# Patient Record
Sex: Female | Born: 1995 | Race: Asian | Hispanic: No | Smoking: Never smoker
Health system: Southern US, Community
[De-identification: ages and names within clinical notes are randomized; demographics above are authoritative.]

## PROBLEM LIST (undated history)

## (undated) DIAGNOSIS — L709 Acne, unspecified: Secondary | ICD-10-CM

## (undated) DIAGNOSIS — Z8759 Personal history of other complications of pregnancy, childbirth and the puerperium: Secondary | ICD-10-CM

## (undated) DIAGNOSIS — Z87440 Personal history of urinary (tract) infections: Secondary | ICD-10-CM

## (undated) DIAGNOSIS — T7840XA Allergy, unspecified, initial encounter: Secondary | ICD-10-CM

## (undated) DIAGNOSIS — J45909 Unspecified asthma, uncomplicated: Secondary | ICD-10-CM

## (undated) HISTORY — PX: NO PAST SURGERIES: SHX2092

## (undated) HISTORY — DX: Unspecified asthma, uncomplicated: J45.909

## (undated) HISTORY — DX: Allergy, unspecified, initial encounter: T78.40XA

## (undated) HISTORY — DX: Acne, unspecified: L70.9

## (undated) HISTORY — DX: Personal history of urinary (tract) infections: Z87.440

## (undated) HISTORY — DX: Personal history of urinary (tract) infections: Z87.59

---
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
## 2014-04-28 ENCOUNTER — Encounter: Payer: Self-pay | Admitting: Family Medicine

## 2014-04-28 ENCOUNTER — Ambulatory Visit (INDEPENDENT_AMBULATORY_CARE_PROVIDER_SITE_OTHER): Payer: No Typology Code available for payment source | Admitting: Family Medicine

## 2014-04-28 VITALS — BP 127/71 | HR 91 | Temp 98.3°F | Resp 18 | Wt 106.0 lb

## 2014-04-28 DIAGNOSIS — J45909 Unspecified asthma, uncomplicated: Secondary | ICD-10-CM

## 2014-04-28 DIAGNOSIS — Z00129 Encounter for routine child health examination without abnormal findings: Secondary | ICD-10-CM

## 2014-04-28 DIAGNOSIS — Z0000 Encounter for general adult medical examination without abnormal findings: Secondary | ICD-10-CM

## 2014-04-28 MED ORDER — ALBUTEROL SULFATE HFA 108 (90 BASE) MCG/ACT IN AERS: 2.0000 | INHALATION_SPRAY | Freq: Four times a day (QID) | RESPIRATORY_TRACT | Status: DC | PRN

## 2014-04-28 NOTE — Progress Notes (Signed)
Patient ID: Sheri Walker Marsico, female   DOB: 12-Aug-1996, 18 y.o.   MRN: [id number]  Subjective:     History was provided by the Patient; her mother was also present.  Sheri Walker Mikles is a 18 y.o. female who is here for this wellness visit.   Current Issues: Current concerns include:None  H (Home) Family Relationships: good Communication: good with parents Responsibilities: has responsibilities at home and is planning to attend school in August at [employer] (Major: Nursing)  E (Education): Grades: As School: good attendance Future Plans: college  A (Activities) Sports: no sports Exercise: No Activities: Friends and Family Friends: Yes   A (Auton/Safety) Auto: wears seat belt Bike: Did Not Ask Safety: can swim  D (Diet) Diet: Research scientist (physical sciences)icky Eater Risky eating habits: none Intake: We discussed the benefits of adequate Ca, Fe, and Vit D intake Body Image: positive body image  Drugs Tobacco: No Alcohol: Has tried, but doesn't like it (She was a little concerned about this and college) Drugs: Tried THC; did not like it. At all.  Sex Activity: sexually active and risky behaviors (Does not use protection; I asked about any desire for birth control (no), or condoms. She was receptive to the information I provided her with regard to safe sex practices with condoms. She admitted to the fact that it was awkward to buy condoms. I supplied her with a small baggy of condoms from our supply room)  Suicide Risk Emotions: healthy Depression: denies feelings of depression Suicidal: denies suicidal ideation     Objective:     Filed Vitals:   04/28/14 1429  BP: 127/71  Pulse: 91  Temp: 98.3 F (36.8 C)  TempSrc: Oral  Resp: 18  Weight: 106 lb (48.081 kg)  SpO2: 100%   Growth parameters are noted and are appropriate for age.  General:   alert, cooperative, appears stated age and no distress  Gait:   normal  Skin:   normal  Oral cavity:   lips, mucosa, and tongue normal; teeth  and gums normal  Eyes:   sclerae white, pupils equal and reactive, red reflex normal bilaterally  Ears:   normal bilaterally  Neck:   normal  Lungs:  clear to auscultation bilaterally  Heart:   regular rate and rhythm, S1, S2 normal, no murmur, click, rub or gallop  Abdomen:  soft, non-tender; bowel sounds normal; no masses,  no organomegaly  GU:  not examined  Extremities:   extremities normal, atraumatic, no cyanosis or edema  Neuro:  normal without focal findings, mental status, speech normal, alert and oriented x3 and PERLA     Assessment:    Healthy 18 y.o. female child.    Plan:   1. Anticipatory guidance discussed. Nutrition, Physical activity and Behavior  2. Follow-up visit in 12 months for next wellness visit, or sooner as needed.

## 2014-04-28 NOTE — Patient Instructions (Signed)
You're here for a well-child check. It was a pleasure meeting you. Good luck beginning school at [employer]. Remember to balance school with friends and exercise. Eat healthy, and stay active!  Remember that if you have any questions or need someone to talk to Dr. Leonides Schanzorsey, myself, or anyone here at the [facility] will make ourselves available for you.  Please see us back in one year.

## 2014-05-01 NOTE — Progress Notes (Signed)
[facility] ATTENDING  NOTE Sheri Mihalik,MD  I have discussed this patient with the resident. I agree with the resident's findings, assessment and care plan.   

## 2014-05-05 ENCOUNTER — Other Ambulatory Visit: Payer: Self-pay | Admitting: Family Medicine

## 2014-10-13 DIAGNOSIS — B009 Herpesviral infection, unspecified: Secondary | ICD-10-CM | POA: Insufficient documentation

## 2014-10-13 HISTORY — DX: Herpesviral infection, unspecified: B00.9

## 2014-10-28 ENCOUNTER — Emergency Department (HOSPITAL_COMMUNITY)
Admission: EM | Admit: 2014-10-28 | Discharge: 2014-10-28 | Disposition: A | Discharge status: Home / Self Care | Payer: Self-pay | Source: Home / Self Care

## 2016-10-23 DIAGNOSIS — Z113 Encounter for screening for infections with a predominantly sexual mode of transmission: Secondary | ICD-10-CM | POA: Diagnosis not present

## 2016-12-12 DIAGNOSIS — Z124 Encounter for screening for malignant neoplasm of cervix: Secondary | ICD-10-CM | POA: Diagnosis not present

## 2016-12-12 DIAGNOSIS — Z30011 Encounter for initial prescription of contraceptive pills: Secondary | ICD-10-CM | POA: Diagnosis not present

## 2017-06-08 DIAGNOSIS — N9089 Other specified noninflammatory disorders of vulva and perineum: Secondary | ICD-10-CM | POA: Diagnosis not present

## 2017-06-08 DIAGNOSIS — Z1159 Encounter for screening for other viral diseases: Secondary | ICD-10-CM | POA: Diagnosis not present

## 2017-06-08 DIAGNOSIS — N760 Acute vaginitis: Secondary | ICD-10-CM | POA: Diagnosis not present

## 2017-06-08 DIAGNOSIS — Z113 Encounter for screening for infections with a predominantly sexual mode of transmission: Secondary | ICD-10-CM | POA: Diagnosis not present

## 2017-06-08 DIAGNOSIS — A609 Anogenital herpesviral infection, unspecified: Secondary | ICD-10-CM | POA: Diagnosis not present

## 2017-06-11 DIAGNOSIS — B009 Herpesviral infection, unspecified: Secondary | ICD-10-CM | POA: Diagnosis not present

## 2017-06-11 DIAGNOSIS — Z113 Encounter for screening for infections with a predominantly sexual mode of transmission: Secondary | ICD-10-CM | POA: Diagnosis not present

## 2019-01-18 ENCOUNTER — Telehealth: Payer: Self-pay | Admitting: *Deleted

## 2019-01-18 NOTE — Telephone Encounter (Signed)
Per Dr Caryl Never it is probably best that the patient is evaluated at [facility] in case there is a need for further treatment that we are not able to do virtually.  I spoke with the patient and advised of this and explained to her the need to be evaluated by UC first.  Pt will call back once well and will at that time schedule her NP appt.   Nothing further needed.

## 2019-01-18 NOTE — Telephone Encounter (Signed)
Fever since Friday 01/14/19 of 98-102.8 and body aches.  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   Pt advised that she may need to be seen and treated at [facility] and then come back to [place] once well for her NP appt.

## 2020-01-14 ENCOUNTER — Ambulatory Visit: Payer: 59 | Attending: Internal Medicine

## 2020-01-14 DIAGNOSIS — Z23 Encounter for immunization: Secondary | ICD-10-CM

## 2020-01-14 NOTE — Progress Notes (Signed)
   Covid-19 Vaccination Clinic  Name:  Sheri Walker    MRN: [id number] DOB: 1996/06/11  01/14/2020  [name] was observed post Covid-19 immunization for 15 minutes without incident. She was provided with Vaccine Information Sheet and instruction to access the V-Safe system.   [name] was instructed to call 911 with any severe reactions post vaccine: Marland Kitchen Difficulty breathing  . Swelling of face and throat  . A fast heartbeat  . A bad rash all over body  . Dizziness and weakness   Immunizations Administered    Name Date Dose VIS Date Route   Pfizer COVID-19 Vaccine 01/14/2020  3:38 PM 0.3 mL 09/23/2019 Intramuscular   Manufacturer: [employer], [employer]   Lot: [id number]   NDC: [address]-7

## 2020-01-16 ENCOUNTER — Encounter (HOSPITAL_COMMUNITY): Payer: Self-pay

## 2020-01-16 ENCOUNTER — Other Ambulatory Visit: Payer: Self-pay

## 2020-01-16 DIAGNOSIS — R0602 Shortness of breath: Secondary | ICD-10-CM | POA: Diagnosis not present

## 2020-01-16 DIAGNOSIS — M7918 Myalgia, other site: Secondary | ICD-10-CM | POA: Insufficient documentation

## 2020-01-16 DIAGNOSIS — R062 Wheezing: Secondary | ICD-10-CM | POA: Insufficient documentation

## 2020-01-16 DIAGNOSIS — Z79899 Other long term (current) drug therapy: Secondary | ICD-10-CM | POA: Diagnosis not present

## 2020-01-16 DIAGNOSIS — Z91010 Allergy to peanuts: Secondary | ICD-10-CM | POA: Diagnosis not present

## 2020-01-16 DIAGNOSIS — Z20822 Contact with and (suspected) exposure to covid-19: Secondary | ICD-10-CM | POA: Diagnosis not present

## 2020-01-16 DIAGNOSIS — R05 Cough: Secondary | ICD-10-CM | POA: Diagnosis not present

## 2020-01-16 DIAGNOSIS — M791 Myalgia, unspecified site: Secondary | ICD-10-CM | POA: Diagnosis not present

## 2020-01-16 DIAGNOSIS — R5383 Other fatigue: Secondary | ICD-10-CM | POA: Diagnosis present

## 2020-01-16 NOTE — ED Triage Notes (Signed)
Pt reports cough, shortness of breath, and body aches that started last night. She is concerned that it could be COVID. Denies any known contacts. A&Ox4. No distress noted.

## 2020-01-17 ENCOUNTER — Emergency Department (HOSPITAL_COMMUNITY)
Admission: EM | Admit: 2020-01-17 | Discharge: 2020-01-17 | Disposition: A | Discharge status: Home / Self Care | Payer: 59 | Attending: Emergency Medicine | Admitting: Emergency Medicine

## 2020-01-17 ENCOUNTER — Emergency Department (HOSPITAL_COMMUNITY): Payer: 59

## 2020-01-17 DIAGNOSIS — R05 Cough: Secondary | ICD-10-CM

## 2020-01-17 DIAGNOSIS — Z20822 Contact with and (suspected) exposure to covid-19: Secondary | ICD-10-CM | POA: Diagnosis not present

## 2020-01-17 DIAGNOSIS — R062 Wheezing: Secondary | ICD-10-CM | POA: Diagnosis not present

## 2020-01-17 DIAGNOSIS — Z91010 Allergy to peanuts: Secondary | ICD-10-CM | POA: Diagnosis not present

## 2020-01-17 DIAGNOSIS — R059 Cough, unspecified: Secondary | ICD-10-CM

## 2020-01-17 DIAGNOSIS — Z79899 Other long term (current) drug therapy: Secondary | ICD-10-CM | POA: Diagnosis not present

## 2020-01-17 DIAGNOSIS — R0602 Shortness of breath: Secondary | ICD-10-CM | POA: Diagnosis not present

## 2020-01-17 DIAGNOSIS — M7918 Myalgia, other site: Secondary | ICD-10-CM | POA: Diagnosis not present

## 2020-01-17 DIAGNOSIS — M791 Myalgia, unspecified site: Secondary | ICD-10-CM

## 2020-01-17 LAB — RESPIRATORY PANEL BY RT PCR (FLU A&B, COVID)
Influenza A by PCR: NEGATIVE
Influenza B by PCR: NEGATIVE
SARS Coronavirus 2 by RT PCR: NEGATIVE

## 2020-01-17 MED ORDER — ALBUTEROL SULFATE HFA 108 (90 BASE) MCG/ACT IN AERS
6.0000 | INHALATION_SPRAY | Freq: Once | RESPIRATORY_TRACT | Status: AC
  Administered 2020-01-17: 03:00:00 6 via RESPIRATORY_TRACT
  Filled 2020-01-17: qty 6.7

## 2020-01-17 MED ORDER — DOXYCYCLINE HYCLATE 100 MG PO CAPS: 100.0000 mg | ORAL_CAPSULE | Freq: Two times a day (BID) | ORAL | 0 refills | Status: AC

## 2020-01-17 NOTE — ED Provider Notes (Signed)
[facility]-EMERGENCY DEPT Provider Note  CSN: [id number] Arrival date & time: 01/16/20 2312  Chief Complaint(s) Cough  HPI Sheri Walker is a 24 y.o. female who presents to the emergency department with with 2 days of generalized fatigue, myalgias, cough with mild shortness of breath and chest tightness.  Patient reports she received the Covid vaccine 1 day prior to symptom onset.  States that she might of had Covid last year but was never tested for it.  She is endorsing low-grade temperatures approximately 100 degrees.  No nausea or vomiting.  No abdominal pain.  No diarrhea.  No known sick contacts currently but reports that she works at [employer].  Reports that her symptoms including shortness of breath and chest tightness have improved while being here.  She reports a history of asthma but has not required albuterol inhalers.  HPI  Past Medical History Past Medical History:  Diagnosis Date  . Acne   . Allergy   . Asthma    Patient Active Problem List   Diagnosis Date Noted  . Well child check 09/29/2012  . DERMATITIS, ATOPIC 07/22/2010  . ACNE VULGARIS, FACIAL 07/22/2010  . 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 ASTHMA, INTERMITTENT 12/10/2006   Home Medication(s) Prior to Admission medications   Medication Sig Start Date End Date Taking? Authorizing Provider  albuterol (PROVENTIL HFA;VENTOLIN HFA) 108 (90 BASE) MCG/ACT inhaler Inhale 2 puffs into the lungs every 6 (six) hours as needed for wheezing. 04/28/14 04/28/15  McKeag, Janine Ores, MD  cephALEXin (KEFLEX) 500 MG capsule Take 1 capsule (500 mg total) by mouth 3 (three) times daily. 07/31/13   Reuben Likes, MD  chlorhexidine (HIBICLENS) 4 % external liquid Use as body wash twice weekly for 3 months. 09/09/13   Reuben Likes, MD  clindamycin-benzoyl peroxide [facility]) gel Apply topically 2 (two) times daily. 09/29/12   McGill, Marga Hoots, MD  doxycycline (VIBRAMYCIN) 100 MG  capsule Take 1 capsule (100 mg total) by mouth 2 (two) times daily for 7 days. 01/17/20 01/24/20  Nira Conn, MD  EPINEPHrine (EPIPEN JR 2-PAK) 0.15 MG/0.3ML injection Inject 0.3 mLs (0.15 mg total) into the muscle as directed. 09/29/12   McGill, Marga Hoots, MD  montelukast (SINGULAIR) 5 MG chewable tablet CHEW 2 TABLETS BY MOUTH AT BEDTIME 05/05/14   Joanna Puff, MD  mupirocin ointment (BACTROBAN) 2 % Apply topically 3 (three) times daily. 07/27/13   Reuben Likes, MD  mupirocin ointment (BACTROBAN) 2 % Apply 1 application topically 3 (three) times daily. 09/09/13   Reuben Likes, MD  sulfamethoxazole-trimethoprim (BACTRIM DS) 800-160 MG per tablet Take 2 tablets by mouth 2 (two) times daily. 07/27/13   Reuben Likes, MD  Past Surgical History History reviewed. No pertinent surgical history. Family History Family History  Problem Relation Age of Onset  . Allergies Mother     Social History Social History   Tobacco Use  . Smoking status: Never Smoker  Substance Use Topics  . Alcohol use: No  . Drug use: No   Allergies Patient has no known allergies.  Review of Systems Review of Systems All other systems are reviewed and are negative for acute change except as noted in the HPI  Physical Exam Vital Signs  I have reviewed the triage vital signs BP (!) 139/103   Pulse (!) 110   Temp 98.2 F (36.8 C) (Oral)   Resp 16   LMP  (LMP Unknown)   SpO2 97%   Physical Exam Vitals reviewed.  Constitutional:      General: She is not in acute distress.    Appearance: She is well-developed. She is not diaphoretic.  HENT:     Head: Normocephalic and atraumatic.     Nose: Nose normal.  Eyes:     General: No scleral icterus.       Right eye: No discharge.        Left eye: No discharge.     Conjunctiva/sclera: Conjunctivae normal.      Pupils: Pupils are equal, round, and reactive to light.  Cardiovascular:     Rate and Rhythm: Normal rate and regular rhythm.     Heart sounds: No murmur. No friction rub. No gallop.   Pulmonary:     Effort: Pulmonary effort is normal. No respiratory distress.     Breath sounds: No stridor. Wheezing (diffuse exp wheezing) present. No rales.  Abdominal:     General: There is no distension.     Palpations: Abdomen is soft.     Tenderness: There is no abdominal tenderness.  Musculoskeletal:        General: No tenderness.     Cervical back: Normal range of motion and neck supple.  Skin:    General: Skin is warm and dry.     Findings: No erythema or rash.  Neurological:     Mental Status: She is alert and oriented to person, place, and time.     ED Results and Treatments Labs (all labs ordered are listed, but only abnormal results are displayed) Labs Reviewed  RESPIRATORY PANEL BY RT PCR (FLU A&B, COVID)                                                                                                                         EKG  EKG Interpretation  Date/Time:  Tuesday January 17 2020 02:44:07 EDT Ventricular Rate:  88 PR Interval:    QRS Duration: 78 QT Interval:  353 QTC Calculation: 428 R Axis:   77 Text Interpretation: Sinus rhythm Biatrial enlargement No old tracing to compare Confirmed by Drema Pry [phone]) on 01/17/2020 2:52:07 AM      Radiology DG Chest [facility] 1 View  Result  Date: 01/17/2020 CLINICAL DATA:  Cough and shortness of breath times 24 hours EXAM: PORTABLE CHEST 1 VIEW COMPARISON:  None. FINDINGS: There is an airspace opacity in the medial right lower lung zone. There is no pneumothorax. No significant pleural effusion. The heart size is normal. There is no acute osseous abnormality. There is a small airspace opacity between the second and third ribs anteriorly on the right. IMPRESSION: Airspace opacity in the medial right lower lung zone consistent with pneumonia.  Follow-up to radiologic resolution is recommended. Small airspace opacity in the right upper lobe may represent an additional infiltrate. Electronically Signed   By: Katherine Mantle M.D.   On: 01/17/2020 02:31    Pertinent labs & imaging results that were available during my care of the patient were reviewed by me and considered in my medical decision making (see chart for details).  Medications Ordered in ED Medications  albuterol (VENTOLIN HFA) 108 (90 Base) MCG/ACT inhaler 6 puff (6 puffs Inhalation Given 01/17/20 0245)                                                                                                                                    Procedures Procedures  (including critical care time)  Medical Decision Making / ED Course I have reviewed the nursing notes for this encounter and the patient's prior records (if available in EHR or on provided paperwork).   Sheri Walker was evaluated in Emergency Department on 01/17/2020 for the symptoms described in the history of present illness. She was evaluated in the context of the global COVID-19 pandemic, which necessitated consideration that the patient might be at risk for infection with the SARS-CoV-2 virus that causes COVID-19. Institutional protocols and algorithms that pertain to the evaluation of patients at risk for COVID-19 are in a state of rapid change based on information released by regulatory bodies including the CDC and federal and state organizations. These policies and algorithms were followed during the patient's care in the ED.  Patient presents with viral symptoms for 2 days. adequate oral hydration. Rest of history as above.  Patient appears well. No signs of toxicity, patient is interactive. No hypoxia, tachypnea or other signs of respiratory distress. No sign of clinical dehydration. Lung exam with diffuse wheezing. Rest of exam as above.  CXR concerning for developing pneumonia.  Albuterol given for  wheezing.  Wheezing completely resolved.  No focal rales on lung sounds on repeat lung exam.  Suspicious for viral process.  COVID test ordered.  Discussed symptomatic treatment with the patient and they will follow closely with their PCP.   Patient prescribed preemptive antibiotics in case Covid test is negative and symptoms do not resolve within 3 to 5 days.     Final Clinical Impression(s) / ED Diagnoses Final diagnoses:  Cough  Myalgia  Wheezing   The patient appears reasonably screened and/or stabilized for discharge and I doubt any other medical  condition or other [facility] requiring further screening, evaluation, or treatment in the ED at this time prior to discharge. Safe for discharge with strict return precautions.  Disposition: Discharge  Condition: Good  I have discussed the results, Dx and Tx plan with the patient/family who expressed understanding and agree(s) with the plan. Discharge instructions discussed at length. The patient/family was given strict return precautions who verbalized understanding of the instructions. No further questions at time of discharge.    ED Discharge Orders         Ordered    doxycycline (VIBRAMYCIN) 100 MG capsule  2 times daily     01/17/20 0352           Follow Up: Primary care provider  Schedule an appointment as soon as possible for a visit        This chart was dictated using voice recognition software.  Despite best efforts to proofread,  errors can occur which can change the documentation meaning.   Fatima Blank, MD 01/17/20 [phone]

## 2020-02-07 ENCOUNTER — Ambulatory Visit: Payer: 59 | Attending: Internal Medicine

## 2020-02-07 DIAGNOSIS — Z23 Encounter for immunization: Secondary | ICD-10-CM

## 2020-02-07 NOTE — Progress Notes (Signed)
   Covid-19 Vaccination Clinic  Name:  Sheri Walker    MRN: [id number] DOB: 09-14-1996  02/07/2020  Sheri Walker was observed post Covid-19 immunization for 15 minutes without incident. She was provided with Vaccine Information Sheet and instruction to access the V-Safe system.   Sheri Walker was instructed to call 911 with any severe reactions post vaccine: Marland Kitchen Difficulty breathing  . Swelling of face and throat  . A fast heartbeat  . A bad rash all over body  . Dizziness and weakness   Immunizations Administered    Name Date Dose VIS Date Route   Pfizer COVID-19 Vaccine 02/07/2020  3:49 PM 0.3 mL 12/07/2018 Intramuscular   Manufacturer: [employer], [employer]   Lot: [id number]   NDC: [address]-1

## 2020-05-14 ENCOUNTER — Ambulatory Visit (INDEPENDENT_AMBULATORY_CARE_PROVIDER_SITE_OTHER): Payer: 59 | Admitting: *Deleted

## 2020-05-14 ENCOUNTER — Other Ambulatory Visit: Payer: Self-pay

## 2020-05-14 DIAGNOSIS — Z3200 Encounter for pregnancy test, result unknown: Secondary | ICD-10-CM

## 2020-05-14 DIAGNOSIS — Z3201 Encounter for pregnancy test, result positive: Secondary | ICD-10-CM | POA: Diagnosis not present

## 2020-05-14 LAB — POCT PREGNANCY, URINE: Preg Test, Ur: POSITIVE — AB

## 2020-05-14 NOTE — Progress Notes (Signed)
AddendumJanalyn Shy called back. I informed her upt was positive . States LMP 04/03/20 this makes her [redacted]w[redacted]d with EDD 01/09/20. She states she is taking prenatal vitamins and no other medicines.  I informed her we recommend she start prenatal care . She would like to go to our office. I informed her I will transfer her to registrar to schedule. She voices understanding. Calloway Andrus,RN

## 2020-05-14 NOTE — Progress Notes (Signed)
Patient dropped off urine for pregnancy test; then left. Pregnancy test positive. I called Loan and reached her voicemail. I left a message that I was calling back with information regarding her visit and will send a MyChart message. Please call us or message [place] if you have questions. Aslan Montagna,RN

## 2020-05-16 NOTE — Progress Notes (Signed)
Patient was assessed and managed by nursing staff during this encounter. I have reviewed the chart and agree with the documentation and plan. I have also made any necessary editorial changes.  Emmanuelle Hibbitts, MD 05/16/2020 8:34 AM   

## 2020-06-25 ENCOUNTER — Other Ambulatory Visit: Payer: Self-pay

## 2020-06-25 ENCOUNTER — Ambulatory Visit (INDEPENDENT_AMBULATORY_CARE_PROVIDER_SITE_OTHER): Payer: 59 | Admitting: Student

## 2020-06-25 ENCOUNTER — Other Ambulatory Visit (HOSPITAL_COMMUNITY)
Admission: RE | Admit: 2020-06-25 | Discharge: 2020-06-25 | Disposition: A | Discharge status: Home / Self Care | Payer: 59 | Source: Ambulatory Visit | Attending: Student | Admitting: Student

## 2020-06-25 ENCOUNTER — Encounter: Payer: Self-pay | Admitting: Student

## 2020-06-25 VITALS — Wt 124.0 lb

## 2020-06-25 DIAGNOSIS — Z3A11 11 weeks gestation of pregnancy: Secondary | ICD-10-CM

## 2020-06-25 DIAGNOSIS — Z3490 Encounter for supervision of normal pregnancy, unspecified, unspecified trimester: Secondary | ICD-10-CM | POA: Insufficient documentation

## 2020-06-25 DIAGNOSIS — Z3491 Encounter for supervision of normal pregnancy, unspecified, first trimester: Secondary | ICD-10-CM | POA: Diagnosis not present

## 2020-06-25 DIAGNOSIS — B009 Herpesviral infection, unspecified: Secondary | ICD-10-CM

## 2020-06-25 DIAGNOSIS — Z23 Encounter for immunization: Secondary | ICD-10-CM | POA: Diagnosis not present

## 2020-06-25 MED ORDER — TERCONAZOLE 0.4 % VA CREA
1.0000 | TOPICAL_CREAM | Freq: Every day | VAGINAL | 0 refills | Status: DC
Start: 2020-06-25 — End: 2020-12-03

## 2020-06-25 MED ORDER — BLOOD PRESSURE KIT DEVI: 1.0000 | Freq: Every day | 0 refills | Status: DC

## 2020-06-25 NOTE — Progress Notes (Signed)
  Subjective:    Sheri Walker is being seen today for her first obstetrical visit.  This is not a planned pregnancy. She is at [redacted]w[redacted]d gestation. Her obstetrical history is significant for nothing. Relationship with FOB: significant other, living together. Patient does intend to breast feed. Pregnancy history fully reviewed.  Patient reports no complaints.  Review of Systems:   Review of Systems  Constitutional: Negative.   HENT: Negative.   Respiratory: Negative.   Cardiovascular: Negative.   Genitourinary: Negative.   Neurological: Negative.   Hematological: Negative.     Objective:     Wt 124 lb (56.2 kg)   LMP 04/03/2020 (Exact Date)  Physical Exam Constitutional:      Appearance: Normal appearance.  HENT:     Head: Normocephalic.  Genitourinary:    General: Normal vulva.     Rectum: Normal.     Comments: NEFG; white clumpy discharge in the vagina, slight bleeding after pap, vaginal walls appear red and irritated.  Musculoskeletal:        General: Normal range of motion.  Skin:    General: Skin is warm.  Neurological:     Mental Status: She is alert.     Maternal Exam:  Introitus: Normal vulva.      Assessment:    Pregnancy: G2P0010 Patient Active Problem List   Diagnosis Date Noted  . Supervision of low-risk pregnancy 06/25/2020  . Well child check 09/29/2012  . DERMATITIS, ATOPIC 07/22/2010  . ACNE VULGARIS, FACIAL 07/22/2010  . 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 ASTHMA, INTERMITTENT 12/10/2006       Plan:     Initial labs drawn. Prenatal vitamins. Problem list reviewed and updated. AFP3 discussed: declined. Role of ultrasound in pregnancy discussed; fetal survey: ordered. Amniocentesis discussed: not indicated. Follow up in 4 weeks for virtual visit 50% of 30 min visit spent on counseling and coordination of care.  -Declined genetic testing -Signed up for [facility] -Will send RX for terazol -Welcomed patient to  practice, explained role of Apps, NPs, students, MDS/DOs.  -Patient got Covid vaccine in April  Charlesetta Garibaldi [facility] 06/25/2020

## 2020-06-25 NOTE — Patient Instructions (Signed)

## 2020-06-26 LAB — CBC/D/PLT+RPR+RH+ABO+RUB AB...
Antibody Screen: NEGATIVE
Basophils Absolute: 0 10*3/uL (ref 0.0–0.2)
Basos: 0 %
EOS (ABSOLUTE): 0.3 10*3/uL (ref 0.0–0.4)
Eos: 3 %
HCV Ab: <0.1 s/co ratio (ref 0.0–0.9)
HIV Screen 4th Generation wRfx: NONREACTIVE
Hematocrit: 43.5 % (ref 34.0–46.6)
Hemoglobin: 14.5 g/dL (ref 11.1–15.9)
Hepatitis B Surface Ag: NEGATIVE
Immature Grans (Abs): 0 10*3/uL (ref 0.0–0.1)
Immature Granulocytes: 0 %
Lymphocytes Absolute: 1 10*3/uL (ref 0.7–3.1)
Lymphs: 11 %
MCH: 29.7 pg (ref 26.6–33.0)
MCHC: 33.3 g/dL (ref 31.5–35.7)
MCV: 89 fL (ref 79–97)
Monocytes Absolute: 0.6 10*3/uL (ref 0.1–0.9)
Monocytes: 7 %
Neutrophils Absolute: 7 10*3/uL (ref 1.4–7.0)
Neutrophils: 79 %
Platelets: 260 10*3/uL (ref 150–450)
RBC: 4.88 x10E6/uL (ref 3.77–5.28)
RDW: 14.2 % (ref 11.7–15.4)
RPR Ser Ql: NONREACTIVE
Rh Factor: POSITIVE
Rubella Antibodies, IGG: 1.15 index (ref >0.99)
WBC: 9 10*3/uL (ref 3.4–10.8)

## 2020-06-26 LAB — CERVICOVAGINAL ANCILLARY ONLY
Bacterial Vaginitis (gardnerella): NEGATIVE
Candida Glabrata: NEGATIVE
Candida Vaginitis: NEGATIVE
Comment: NEGATIVE
Comment: NEGATIVE
Comment: NEGATIVE

## 2020-06-26 LAB — CYTOLOGY - PAP
Chlamydia: NEGATIVE
Comment: NEGATIVE
Comment: NEGATIVE
Comment: NORMAL
Diagnosis: NEGATIVE
Neisseria Gonorrhea: NEGATIVE
Trichomonas: NEGATIVE

## 2020-06-26 LAB — POCT URINALYSIS DIP (DEVICE)
Bilirubin Urine: NEGATIVE
Glucose, UA: NEGATIVE mg/dL
Hgb urine dipstick: NEGATIVE
Ketones, ur: NEGATIVE mg/dL
Nitrite: NEGATIVE
Protein, ur: NEGATIVE mg/dL
Specific Gravity, Urine: 1.02 (ref 1.005–1.030)
Urobilinogen, UA: 0.2 mg/dL (ref 0.0–1.0)
pH: 7.5 (ref 5.0–8.0)

## 2020-06-26 LAB — HEMOGLOBIN A1C
Est. average glucose Bld gHb Est-mCnc: 97 mg/dL
Hgb A1c MFr Bld: 5 % (ref 4.8–5.6)

## 2020-06-26 LAB — HCV INTERPRETATION

## 2020-06-30 LAB — URINE CULTURE, OB REFLEX

## 2020-06-30 LAB — CULTURE, OB URINE

## 2020-07-02 ENCOUNTER — Other Ambulatory Visit: Payer: Self-pay | Admitting: Student

## 2020-07-02 DIAGNOSIS — O2341 Unspecified infection of urinary tract in pregnancy, first trimester: Secondary | ICD-10-CM | POA: Insufficient documentation

## 2020-07-02 MED ORDER — CEPHALEXIN 500 MG PO CAPS: 500.0000 mg | ORAL_CAPSULE | Freq: Four times a day (QID) | ORAL | 0 refills | Status: DC

## 2020-07-23 ENCOUNTER — Encounter: Payer: Self-pay | Admitting: *Deleted

## 2020-07-23 ENCOUNTER — Ambulatory Visit (INDEPENDENT_AMBULATORY_CARE_PROVIDER_SITE_OTHER): Payer: 59 | Admitting: Student

## 2020-07-23 VITALS — BP 130/84 | HR 95 | Wt 136.3 lb

## 2020-07-23 DIAGNOSIS — Z3492 Encounter for supervision of normal pregnancy, unspecified, second trimester: Secondary | ICD-10-CM

## 2020-07-23 DIAGNOSIS — Z3A15 15 weeks gestation of pregnancy: Secondary | ICD-10-CM

## 2020-07-23 MED ORDER — VALACYCLOVIR HCL 1 G PO TABS: 1000.0000 mg | ORAL_TABLET | Freq: Two times a day (BID) | ORAL | 5 refills | Status: DC

## 2020-07-23 MED ORDER — EPINEPHRINE 0.3 MG/0.3ML IJ SOAJ: 0.3000 mg | INTRAMUSCULAR | 1 refills | Status: DC | PRN

## 2020-07-23 MED ORDER — ALBUTEROL SULFATE HFA 108 (90 BASE) MCG/ACT IN AERS: 2.0000 | INHALATION_SPRAY | Freq: Four times a day (QID) | RESPIRATORY_TRACT | 1 refills | Status: DC | PRN

## 2020-07-23 MED ORDER — BUDESONIDE 90 MCG/ACT IN AEPB: 1.0000 | INHALATION_SPRAY | Freq: Two times a day (BID) | RESPIRATORY_TRACT | 4 refills | Status: DC

## 2020-07-23 NOTE — Progress Notes (Signed)
   PRENATAL VISIT NOTE  Subjective:  Sheri Walker is a 24 y.o. G2P0010 at [redacted]w[redacted]d being seen today for ongoing prenatal care.  She is currently monitored for the following issues for this low-risk pregnancy and has ASTHMA, INTERMITTENT; DERMATITIS, ATOPIC; ACNE VULGARIS, FACIAL; PERSONAL HISTORY OF ALLERGY TO PEANUTS; Well child check; Supervision of low-risk pregnancy; HSV-2 (herpes simplex virus 2) infection; and UTI (urinary tract infection) in pregnancy in first trimester on their problem list.  Patient reports no complaints. Had HSV small outbreak, took Valtrex and it went away. She reports that her epipen and her inhaler are both expired. She is having to use her inhaler at night at least times a week "right before bed". She reports wheezes that are audible to her and her boyfriend.    Contractions: Not present. Vag. Bleeding: None.  Movement: Absent. Denies leaking of fluid.   The following portions of the patient's history were reviewed and updated as appropriate: allergies, current medications, past family history, past medical history, past social history, past surgical history and problem list.   Objective:   Vitals:   07/23/20 1005  BP: 130/84  Pulse: 95  Weight: 136 lb 4.8 oz (61.8 kg)    Fetal Status: Fetal Heart Rate (bpm): 158   Movement: Absent     General:  Alert, oriented and cooperative. Patient is in no acute distress.  Skin: Skin is warm and dry. No rash noted.   Cardiovascular: Normal heart rate noted  Respiratory: Normal respiratory effort, no problems with respiration noted  Abdomen: Soft, gravid, appropriate for gestational age.  Pain/Pressure: Absent     Pelvic: Cervical exam deferred        Extremities: Normal range of motion.  Edema: None  Mental Status: Normal mood and affect. Normal behavior. Normal judgment and thought content.   Assessment and Plan:  Pregnancy: G2P0010 at [redacted]w[redacted]d 1. Encounter for supervision of low-risk pregnancy in second  trimester -RX for Valtrex given -Rx for Epi pen and Inhaler given -Given that patient is reporting more audible wheezes at night, will recommend steriod inhaler (1 puff twice a day at the lowest dose). Also refill given on rescue inhaler.  - Culture, OB Urine - AFP, Serum, Open Spina Bifida - albuterol (VENTOLIN HFA) 108 (90 Base) MCG/ACT inhaler; Inhale 2 puffs into the lungs every 6 (six) hours as needed for wheezing.  Dispense: 1 each; Refill: 1  Preterm labor symptoms and general obstetric precautions including but not limited to vaginal bleeding, contractions, leaking of fluid and fetal movement were reviewed in detail with the patient. Please refer to After Visit Summary for other counseling recommendations.   Return in about 4 weeks (around 08/20/2020), or Try to make appt on same day as [place] on 11/8.  Future Appointments  Date Time Provider Department Center  08/20/2020  8:45 AM [facility] US5 [facility]  08/20/2020  1:35 PM Raelyn Mora, CNM [facility]    Marylene Land, [place]

## 2020-07-24 LAB — AFP, SERUM, OPEN SPINA BIFIDA
AFP MoM: 1.25
AFP Value: 42.8 ng/mL
Gest. Age on Collection Date: 15.6 weeks
Maternal Age At EDD: 25.4 yr
OSBR Risk 1 IN: 5576
Test Results:: NEGATIVE
Weight: 136 [lb_av]

## 2020-07-25 LAB — CULTURE, OB URINE

## 2020-07-25 LAB — URINE CULTURE, OB REFLEX

## 2020-08-15 ENCOUNTER — Other Ambulatory Visit: Payer: Self-pay | Admitting: Student

## 2020-08-15 ENCOUNTER — Telehealth: Payer: Self-pay | Admitting: Student

## 2020-08-15 MED ORDER — BUDESONIDE-FORMOTEROL FUMARATE 80-4.5 MCG/ACT IN AERO: 2.0000 | INHALATION_SPRAY | Freq: Two times a day (BID) | RESPIRATORY_TRACT | 12 refills | Status: DC

## 2020-08-20 ENCOUNTER — Ambulatory Visit: Payer: 59 | Attending: Student

## 2020-08-20 ENCOUNTER — Other Ambulatory Visit: Payer: Self-pay

## 2020-08-20 ENCOUNTER — Ambulatory Visit (INDEPENDENT_AMBULATORY_CARE_PROVIDER_SITE_OTHER): Payer: 59 | Admitting: Obstetrics and Gynecology

## 2020-08-20 ENCOUNTER — Encounter: Payer: Self-pay | Admitting: Obstetrics and Gynecology

## 2020-08-20 VITALS — BP 121/70 | HR 75 | Wt 139.0 lb

## 2020-08-20 DIAGNOSIS — Z3A19 19 weeks gestation of pregnancy: Secondary | ICD-10-CM

## 2020-08-20 DIAGNOSIS — Z3491 Encounter for supervision of normal pregnancy, unspecified, first trimester: Secondary | ICD-10-CM | POA: Diagnosis not present

## 2020-08-20 DIAGNOSIS — Z3492 Encounter for supervision of normal pregnancy, unspecified, second trimester: Secondary | ICD-10-CM

## 2020-08-20 IMAGING — US US MFM OB COMP +14 WKS
1 series · 13 of 28 positions shown · non-contrast
Comparison: none

[Series 1: us mfm ob comp +14 wks · 100 acquisitions, 13 frames shown]
[im 4/100]
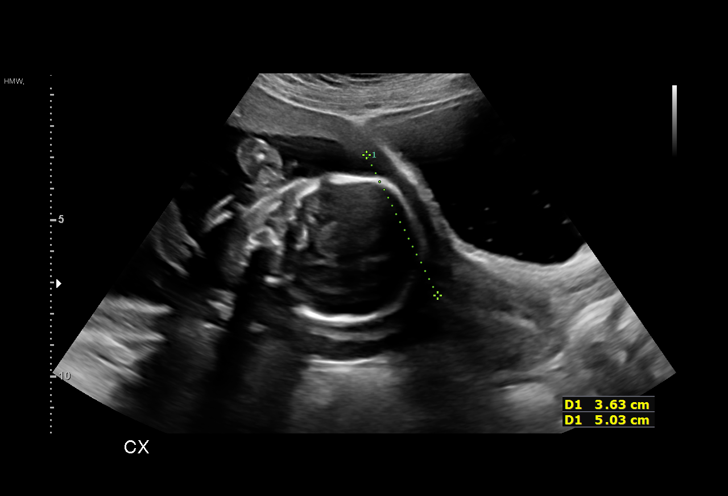
[im 12/100]
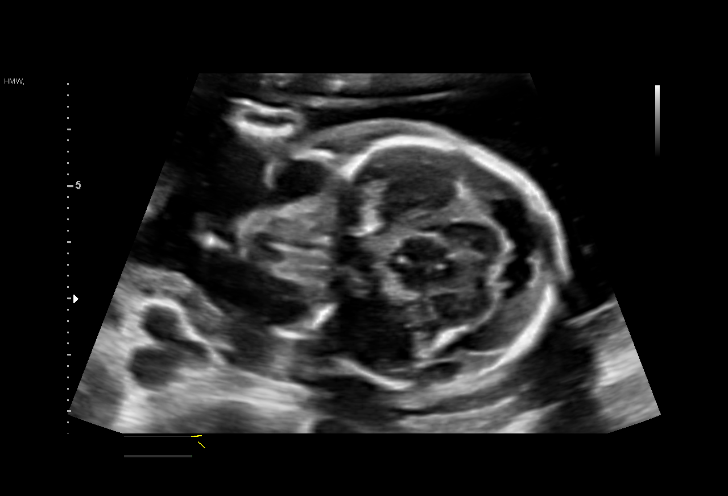
[im 19/100]
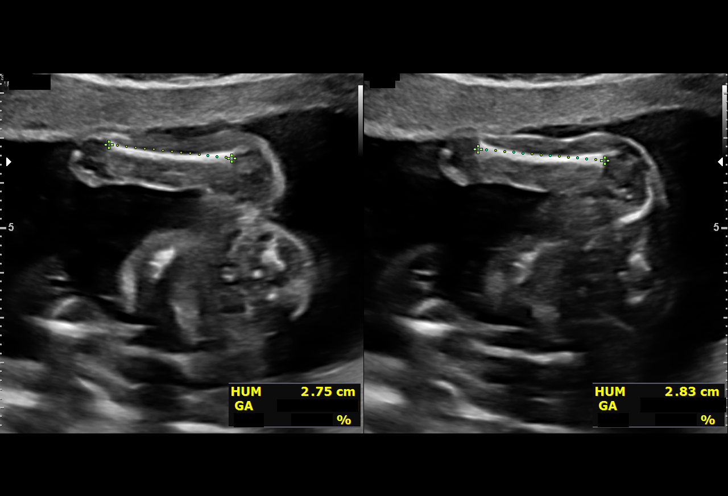
[im 26/100]
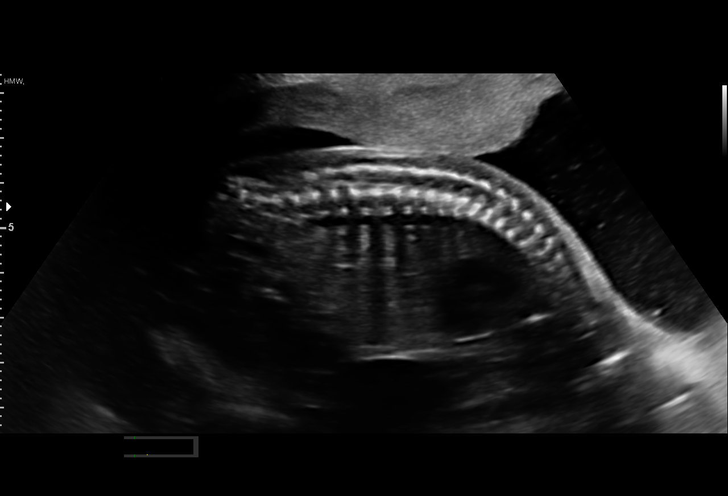
[im 34/100]
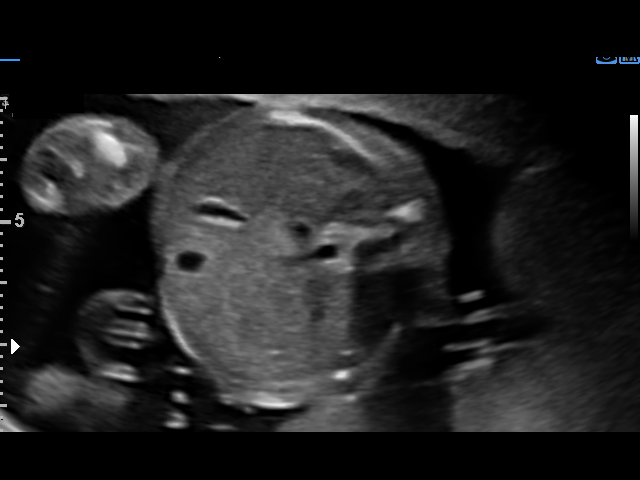
[im 41/100]
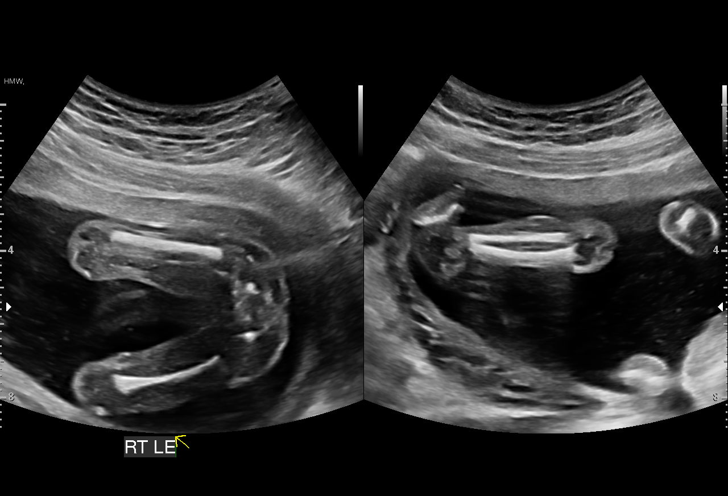
[im 52/100]
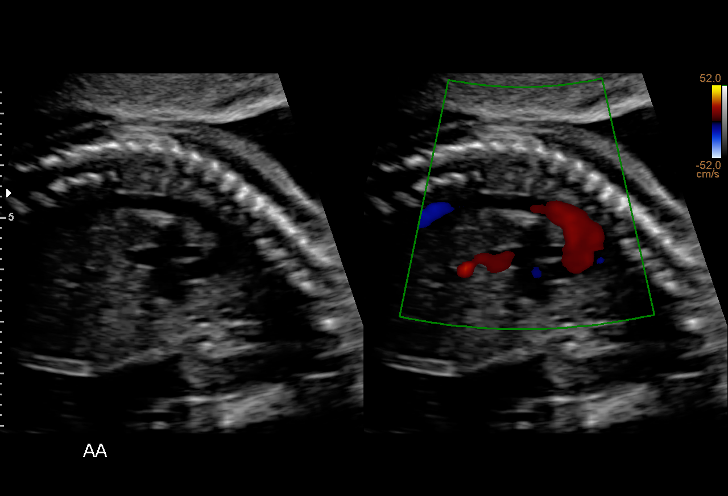
[im 59/100]
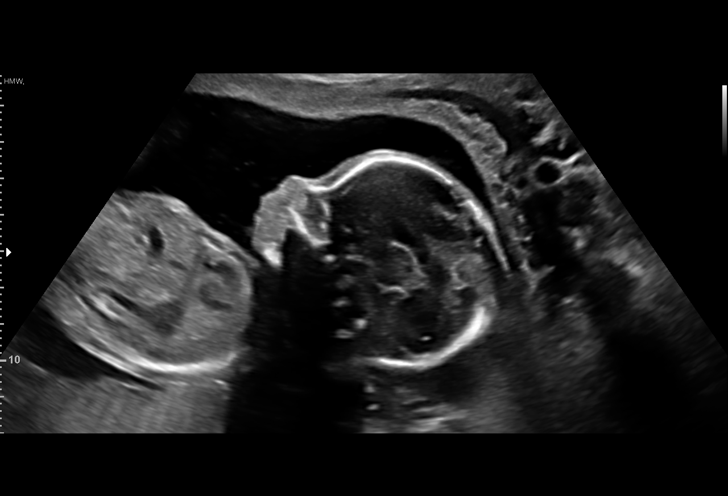
[im 67/100]
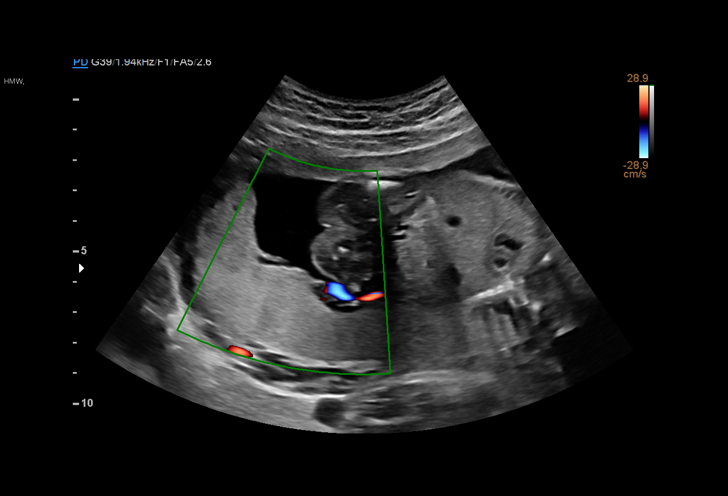
[im 74/100]
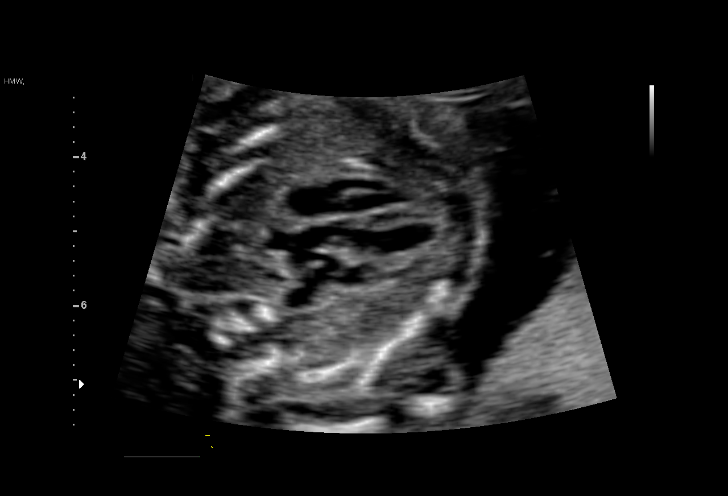
[im 81/100]
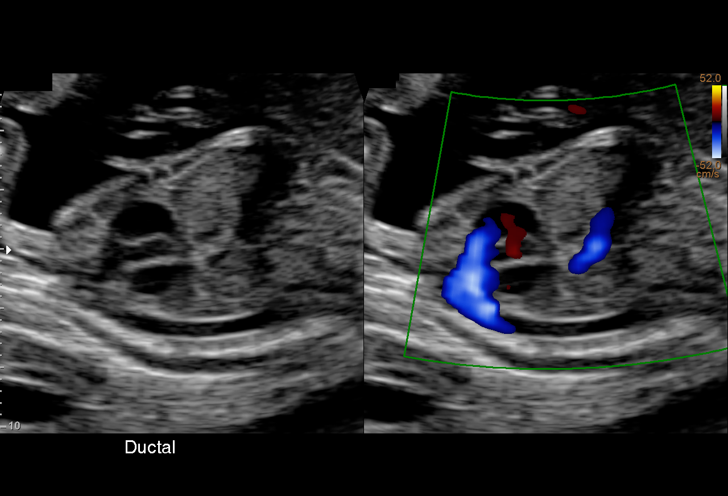
[im 89/100]
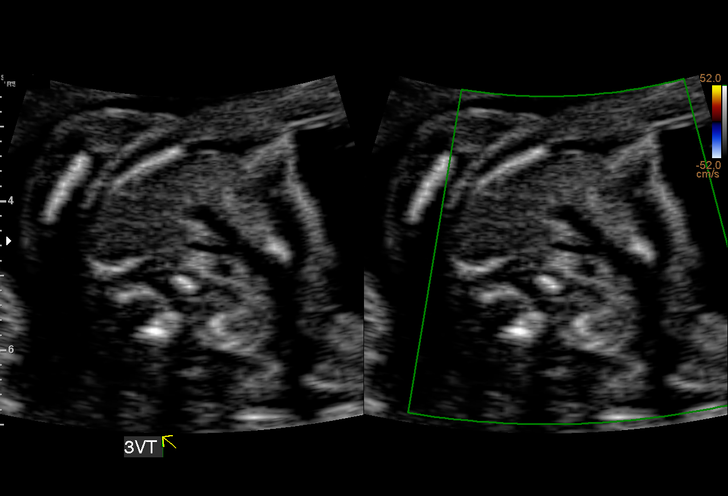
[im 96/100]
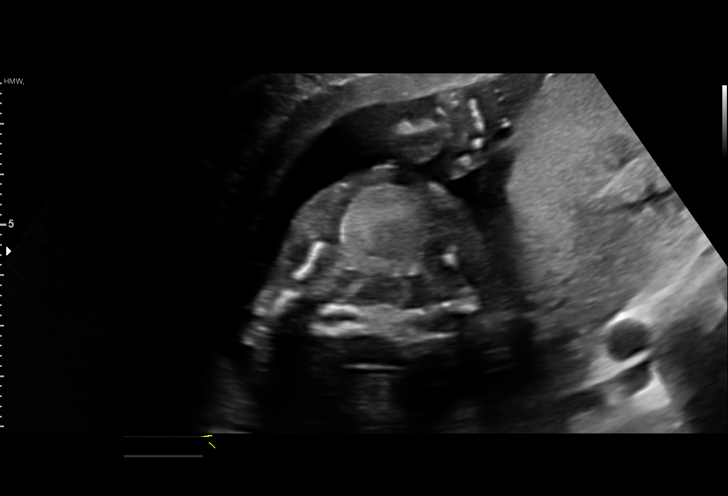

[13 of 28 positions shown; findings below may reference images not displayed]

1  US MFM OB COMP + 14 WK                76805.01    MOATSHE

Indications

 19 weeks gestation of pregnancy
 Encounter for antenatal screening for          [JZ]
 malformations
 Neg AFP
Fetal Evaluation

 Num Of Fetuses:         1
 Fetal Heart Rate(bpm):  143
 Cardiac Activity:       Observed
 Presentation:           Cephalic
 Placenta:               Posterior Fundal
 P. Cord Insertion:      Visualized, central

 Amniotic Fluid
 AFI FV:      Within normal limits

                             Largest Pocket(cm)

Biometry

 BPD:      46.2  mm     G. Age:  20w 0d         55  %    CI:        75.35   %    70 - 86
                                                         FL/HC:      18.5   %    16.8 -
 HC:      168.8  mm     G. Age:  19w 4d         27  %    HC/AC:      1.07        1.09 -
 AC:      157.9  mm     G. Age:  20w 6d         78  %    FL/BPD:     67.7   %
 FL:       31.3  mm     G. Age:  19w 5d         37  %    FL/AC:      19.8   %    20 - 24
 HUM:      27.9  mm     G. Age:  19w 0d         30  %
 CER:      20.8  mm     G. Age:  19w 6d         69  %
 CM:        6.3  mm

 Est. FW:     343  gm    0 lb 12 oz      70  %
OB History

 Gravidity:    2         Term:   0        Prem:   0        SAB:   0
 TOP:          1       Ectopic:  0        Living: 0
Gestational Age

 LMP:           19w 6d        Date:  [DATE]                 EDD:   [DATE]
 U/S Today:     20w 0d                                        EDD:   [DATE]
 Best:          19w 6d     Det. By:  LMP  ([DATE])          EDD:   [DATE]
Anatomy

 Cranium:               Appears normal         LVOT:                   Appears normal
 Cavum:                 Appears normal         Aortic Arch:            Appears normal
 Ventricles:            Appears normal         Ductal Arch:            Appears normal
 Choroid Plexus:        Appears normal         Diaphragm:              Appears normal
 Cerebellum:            Appears normal         Stomach:                Appears normal, left
                                                                       sided
 Posterior Fossa:       Appears normal         Abdomen:                Appears normal
 Nuchal Fold:           Not applicable (>20    Abdominal Wall:         Appears nml (cord
                        wks GA)                                        insert, abd wall)
 Face:                  Appears normal         Cord Vessels:           Appears normal (3
                        (orbits and profile)                           vessel cord)
 Lips:                  Appears normal         Kidneys:                Appear normal
 Palate:                Appears normal         Bladder:                Appears normal
 Thoracic:              Appears normal         Spine:                  Appears normal
 Heart:                 Appears normal         Upper Extremities:      Appears normal
                        (4CH, axis, and
                        situs)
 RVOT:                  Appears normal         Lower Extremities:      Appears normal

 Other:  Heels visualized. Hands not well visualized. Female fetus. Parents do
         not wish to know sex of fetus. Technically difficult due to fetal position.
Cervix Uterus Adnexa

 Cervix
 Length:            4.3  cm.
 Closed . Normal appearance by transabdominal scan.

 Uterus
 No abnormality visualized.

 Right Ovary
 No adnexal mass visualized.

 Left Ovary
 No adnexal mass visualized.

 Cul De Sac
 No free fluid seen.
 Adnexa
 No abnormality visualized.
Impression

 We performed fetal anatomy scan. No makers of
 aneuploidies or fetal structural defects are seen. Fetal
 biometry is consistent with her previously-established dates.
 Amniotic fluid is normal and good fetal activity is seen.
 Patient understands the limitations of ultrasound in detecting
 fetal anomalies.
 Patient had opted not to screen for fetal aneuploidies.
 MSAFP screening showed low risk for open-neural tube
 defects .

 "MyChart":Patient does not want to know fetal sex/gender.
 We informed her that fetal sex/gender will be mentioned in
 ultrasound report and will be in "MyChart" that will be
 accessible to the patient .
Recommendations

 Follow-up scans as clinically indicated.
                 MOATSHE

## 2020-08-20 NOTE — Patient Instructions (Signed)

## 2020-08-20 NOTE — Progress Notes (Signed)
   LOW-RISK PREGNANCY OFFICE VISIT Patient name: Sheri Walker MRN [id number]  Date of birth: 09/03/1996 Chief Complaint:   Routine Prenatal Visit  History of Present Illness:   Sheri Walker is a 24 y.o. G70P0010 female at [redacted]w[redacted]d with an Estimated Date of Delivery: 01/08/21 being seen today for ongoing management of a low-risk pregnancy.  Today she reports no complaints. Contractions: Not present. Vag. Bleeding: None.  Movement: Absent. denies leaking of fluid. Review of Systems:   Pertinent items are noted in HPI Denies abnormal vaginal discharge w/ itching/odor/irritation, headaches, visual changes, shortness of breath, chest pain, abdominal pain, severe nausea/vomiting, or problems with urination or bowel movements unless otherwise stated above. Pertinent History Reviewed:  Reviewed past medical,surgical, social, obstetrical and family history.  Reviewed problem list, medications and allergies. Physical Assessment:   Vitals:   08/20/20 1338  BP: 121/70  Pulse: 75  Weight: 139 lb (63 kg)  There is no height or weight on file to calculate BMI.        Physical Examination:   General appearance: Well appearing, and in no distress  Mental status: Alert, oriented to person, place, and time  Skin: Warm & dry  Cardiovascular: Normal heart rate noted  Respiratory: Normal respiratory effort, no distress  Abdomen: Soft, gravid, nontender  Pelvic: Cervical exam deferred         Extremities: Edema: None  Fetal Status: Fetal Heart Rate (bpm): 143   Movement: Absent     Assessment & Plan:  1) Low-risk pregnancy G2P0010 at [redacted]w[redacted]d with an Estimated Date of Delivery: 01/08/21   2) Encounter for supervision of low-risk pregnancy in second trimester - Reviewed U/S results - Advised to not look at U/S results in My Chart since they do not want to know the gender at this time  3) [redacted] weeks gestation of pregnancy   Meds: No orders of the defined types were placed in this  encounter.  Labs/procedures today: Anatomy U/S  Plan:  Continue routine obstetrical care   Reviewed: Preterm labor symptoms and general obstetric precautions including but not limited to vaginal bleeding, contractions, leaking of fluid and fetal movement were reviewed in detail with the patient.  All questions were answered. Has home bp cuff. Check bp weekly, let us know if >140/90.   Follow-up: Return in about 4 weeks (around 09/17/2020) for Return OB - My Chart video.   Raelyn Mora MSN, CNM 08/20/2020 1:52 PM

## 2020-09-05 ENCOUNTER — Other Ambulatory Visit: Payer: Self-pay

## 2020-09-05 ENCOUNTER — Encounter (HOSPITAL_COMMUNITY): Payer: Self-pay | Admitting: Obstetrics and Gynecology

## 2020-09-05 ENCOUNTER — Inpatient Hospital Stay (HOSPITAL_COMMUNITY)
Admission: AD | Admit: 2020-09-05 | Discharge: 2020-09-08 | DRG: 832 | Disposition: A | Discharge status: Home / Self Care | Payer: 59 | Attending: Obstetrics and Gynecology | Admitting: Obstetrics and Gynecology

## 2020-09-05 ENCOUNTER — Inpatient Hospital Stay (HOSPITAL_COMMUNITY): Payer: 59

## 2020-09-05 DIAGNOSIS — Z20822 Contact with and (suspected) exposure to covid-19: Secondary | ICD-10-CM | POA: Diagnosis not present

## 2020-09-05 DIAGNOSIS — Z3A22 22 weeks gestation of pregnancy: Secondary | ICD-10-CM

## 2020-09-05 DIAGNOSIS — O2300 Infections of kidney in pregnancy, unspecified trimester: Secondary | ICD-10-CM

## 2020-09-05 DIAGNOSIS — O99282 Endocrine, nutritional and metabolic diseases complicating pregnancy, second trimester: Secondary | ICD-10-CM | POA: Diagnosis not present

## 2020-09-05 DIAGNOSIS — E871 Hypo-osmolality and hyponatremia: Secondary | ICD-10-CM | POA: Diagnosis not present

## 2020-09-05 DIAGNOSIS — Z87440 Personal history of urinary (tract) infections: Secondary | ICD-10-CM | POA: Diagnosis present

## 2020-09-05 DIAGNOSIS — O2302 Infections of kidney in pregnancy, second trimester: Secondary | ICD-10-CM | POA: Diagnosis not present

## 2020-09-05 DIAGNOSIS — N12 Tubulo-interstitial nephritis, not specified as acute or chronic: Secondary | ICD-10-CM | POA: Diagnosis not present

## 2020-09-05 DIAGNOSIS — A6000 Herpesviral infection of urogenital system, unspecified: Secondary | ICD-10-CM | POA: Diagnosis present

## 2020-09-05 DIAGNOSIS — B9620 Unspecified Escherichia coli [E. coli] as the cause of diseases classified elsewhere: Secondary | ICD-10-CM | POA: Diagnosis present

## 2020-09-05 DIAGNOSIS — O98312 Other infections with a predominantly sexual mode of transmission complicating pregnancy, second trimester: Secondary | ICD-10-CM | POA: Diagnosis not present

## 2020-09-05 DIAGNOSIS — Z8759 Personal history of other complications of pregnancy, childbirth and the puerperium: Secondary | ICD-10-CM | POA: Diagnosis present

## 2020-09-05 DIAGNOSIS — E876 Hypokalemia: Secondary | ICD-10-CM | POA: Diagnosis present

## 2020-09-05 DIAGNOSIS — N1330 Unspecified hydronephrosis: Secondary | ICD-10-CM | POA: Diagnosis not present

## 2020-09-05 DIAGNOSIS — R509 Fever, unspecified: Secondary | ICD-10-CM | POA: Diagnosis not present

## 2020-09-05 LAB — CBC WITH DIFFERENTIAL/PLATELET
Abs Immature Granulocytes: 0.14 10*3/uL — ABNORMAL HIGH (ref 0.00–0.07)
Basophils Absolute: 0 10*3/uL (ref 0.0–0.1)
Basophils Relative: 0 %
Eosinophils Absolute: 0 10*3/uL (ref 0.0–0.5)
Eosinophils Relative: 0 %
HCT: 35.2 % — ABNORMAL LOW (ref 36.0–46.0)
Hemoglobin: 11.7 g/dL — ABNORMAL LOW (ref 12.0–15.0)
Immature Granulocytes: 1 %
Lymphocytes Relative: 4 %
Lymphs Abs: 0.6 10*3/uL — ABNORMAL LOW (ref 0.7–4.0)
MCH: 31.9 pg (ref 26.0–34.0)
MCHC: 33.2 g/dL (ref 30.0–36.0)
MCV: 95.9 fL (ref 80.0–100.0)
Monocytes Absolute: 2.2 10*3/uL — ABNORMAL HIGH (ref 0.1–1.0)
Monocytes Relative: 14 %
Neutro Abs: 13.2 10*3/uL — ABNORMAL HIGH (ref 1.7–7.7)
Neutrophils Relative %: 81 %
Platelets: 189 10*3/uL (ref 150–400)
RBC: 3.67 MIL/uL — ABNORMAL LOW (ref 3.87–5.11)
RDW: 12.3 % (ref 11.5–15.5)
WBC: 16.2 10*3/uL — ABNORMAL HIGH (ref 4.0–10.5)
nRBC: 0 % (ref 0.0–0.2)

## 2020-09-05 LAB — COMPREHENSIVE METABOLIC PANEL
ALT: 22 U/L (ref 0–44)
AST: 19 U/L (ref 15–41)
Albumin: 2.6 g/dL — ABNORMAL LOW (ref 3.5–5.0)
Alkaline Phosphatase: 45 U/L (ref 38–126)
Anion gap: 9 (ref 5–15)
BUN: <5 mg/dL — ABNORMAL LOW (ref 6–20)
CO2: 20 mmol/L — ABNORMAL LOW (ref 22–32)
Calcium: 8.2 mg/dL — ABNORMAL LOW (ref 8.9–10.3)
Chloride: 101 mmol/L (ref 98–111)
Creatinine, Ser: 0.71 mg/dL (ref 0.44–1.00)
GFR, Estimated: >60 mL/min (ref >60)
Glucose, Bld: 121 mg/dL — ABNORMAL HIGH (ref 70–99)
Potassium: 3.3 mmol/L — ABNORMAL LOW (ref 3.5–5.1)
Sodium: 130 mmol/L — ABNORMAL LOW (ref 135–145)
Total Bilirubin: 0.3 mg/dL (ref 0.3–1.2)
Total Protein: 5.7 g/dL — ABNORMAL LOW (ref 6.5–8.1)

## 2020-09-05 LAB — URINALYSIS, ROUTINE W REFLEX MICROSCOPIC
Bilirubin Urine: NEGATIVE
Glucose, UA: 50 mg/dL — AB
Hgb urine dipstick: NEGATIVE
Ketones, ur: NEGATIVE mg/dL
Nitrite: NEGATIVE
Protein, ur: NEGATIVE mg/dL
Specific Gravity, Urine: 1.004 — ABNORMAL LOW (ref 1.005–1.030)
pH: 6 (ref 5.0–8.0)

## 2020-09-05 LAB — RESPIRATORY PANEL BY RT PCR (FLU A&B, COVID)
Influenza A by PCR: NEGATIVE
Influenza B by PCR: NEGATIVE
SARS Coronavirus 2 by RT PCR: NEGATIVE

## 2020-09-05 LAB — TYPE AND SCREEN
ABO/RH(D): O POS
Antibody Screen: NEGATIVE

## 2020-09-05 MED ORDER — SODIUM CHLORIDE 0.9 % IV SOLN
2.0000 g | INTRAVENOUS | Status: DC
  Administered 2020-09-05 – 2020-09-07 (×3): 2 g via INTRAVENOUS
  Filled 2020-09-05 (×4): qty 20

## 2020-09-05 MED ORDER — CALCIUM CARBONATE ANTACID 500 MG PO CHEW: 2.0000 | CHEWABLE_TABLET | ORAL | Status: DC | PRN

## 2020-09-05 MED ORDER — ACETAMINOPHEN 325 MG PO TABS
650.0000 mg | ORAL_TABLET | ORAL | Status: DC | PRN
  Administered 2020-09-05 – 2020-09-08 (×7): 650 mg via ORAL
  Filled 2020-09-05 (×7): qty 2

## 2020-09-05 MED ORDER — ZOLPIDEM TARTRATE 5 MG PO TABS: 5.0000 mg | ORAL_TABLET | Freq: Every evening | ORAL | Status: DC | PRN

## 2020-09-05 MED ORDER — SODIUM CHLORIDE 0.9 % IV SOLN
INTRAVENOUS | Status: DC
  Administered 2020-09-05: 125 mL/h via INTRAVENOUS

## 2020-09-05 MED ORDER — POTASSIUM CHLORIDE CRYS ER 20 MEQ PO TBCR
20.0000 meq | EXTENDED_RELEASE_TABLET | Freq: Two times a day (BID) | ORAL | Status: AC
  Administered 2020-09-05 – 2020-09-07 (×6): 20 meq via ORAL
  Filled 2020-09-05 (×7): qty 1

## 2020-09-05 MED ORDER — PRENATAL MULTIVITAMIN CH
1.0000 | ORAL_TABLET | Freq: Every day | ORAL | Status: DC
  Administered 2020-09-05 – 2020-09-07 (×3): 1 via ORAL
  Filled 2020-09-05 (×4): qty 1

## 2020-09-05 MED ORDER — DOCUSATE SODIUM 100 MG PO CAPS
100.0000 mg | ORAL_CAPSULE | Freq: Every day | ORAL | Status: DC
  Filled 2020-09-05 (×2): qty 1

## 2020-09-05 NOTE — MAU Provider Note (Signed)
History     CSN: [id number]  Arrival date and time: 09/05/20 1003   First Provider Initiated Contact with Patient 09/05/20 1207      Chief Complaint  Patient presents with  . Urinary Tract Infection   HPI   Sheri Walker is a 24 y.o. female G2P0010 @ 12w1dhere in MAU with complaints of fever, dark urine, dysuria, right lower back pain. Symptoms have been present for 3 days. She reports her fever as high as 103. She has been taking tylenol around the clock, her last dose was yesterday. She is fully vaccinated for Covid 19. She has not had any sick contacts. No one in her house has similar symptoms.   OB History    Gravida  2   Para  0   Term  0   Preterm  0   AB  1   Living  0     SAB  0   TAB  1   Ectopic  0   Multiple  0   Live Births  0           Past Medical History:  Diagnosis Date  . Acne   . Allergy   . Asthma   . HSV-2 (herpes simplex virus 2) infection 2016    History reviewed. No pertinent surgical history.  Family History  Problem Relation Age of Onset  . Allergies Mother   . Hyperlipidemia Father     Social History   Tobacco Use  . Smoking status: Never Smoker  Substance Use Topics  . Alcohol use: Not Currently  . Drug use: Not Currently    Types: Marijuana    Allergies:  Allergies  Allergen Reactions  . Peanut-Containing Drug Products Anaphylaxis    All Nuts     Medications Prior to Admission  Medication Sig Dispense Refill Last Dose  . acetaminophen (TYLENOL) 325 MG tablet Take 650 mg by mouth every 6 (six) hours as needed for moderate pain or fever.   09/04/2020 at 1500  . budesonide-formoterol (SYMBICORT) 80-4.5 MCG/ACT inhaler Inhale 2 puffs into the lungs 2 (two) times daily. 1 each 12 Past Month at Unknown time  . Prenatal Vit-Fe Fumarate-FA (MULTIVITAMIN-PRENATAL) 27-0.8 MG TABS tablet Take 1 tablet by mouth daily at 12 noon.   09/04/2020 at Unknown time  . valACYclovir (VALTREX) 1000 MG tablet Take 1  tablet (1,000 mg total) by mouth 2 (two) times daily. 10 tablet 5 Past Month at Unknown time  . Blood Pressure Monitoring (BLOOD PRESSURE KIT) DEVI 1 Device by Does not apply route daily. (Patient not taking: Reported on 07/23/2020) 1 each 0   . EPINEPHrine 0.3 mg/0.3 mL IJ SOAJ injection Inject 0.3 mg into the muscle as needed for anaphylaxis. 1 each 1 More than a month at Unknown time  . terconazole (TERAZOL 7) 0.4 % vaginal cream Place 1 applicator vaginally at bedtime. (Patient not taking: Reported on 07/23/2020) 45 g 0    Results for orders placed or performed during the hospital encounter of 09/05/20 (from the past 48 hour(s))  Urinalysis, Routine w reflex microscopic Urine, Clean Catch     Status: Abnormal   Collection Time: 09/05/20 10:26 AM  Result Value Ref Range   Color, Urine YELLOW YELLOW   APPearance HAZY (A) CLEAR   Specific Gravity, Urine 1.004 (L) 1.005 - 1.030   pH 6.0 5.0 - 8.0   Glucose, UA 50 (A) NEGATIVE mg/dL   Hgb urine dipstick NEGATIVE NEGATIVE   Bilirubin Urine  NEGATIVE NEGATIVE   Ketones, ur NEGATIVE NEGATIVE mg/dL   Protein, ur NEGATIVE NEGATIVE mg/dL   Nitrite NEGATIVE NEGATIVE   Leukocytes,Ua MODERATE (A) NEGATIVE   RBC / HPF 0-5 0 - 5 RBC/hpf   WBC, UA 11-20 0 - 5 WBC/hpf   Bacteria, UA MANY (A) NONE SEEN   Squamous Epithelial / LPF 0-5 0 - 5    Comment: Performed at [facility], [place] [address], [place], [place] [address]  CBC with Differential/Platelet     Status: Abnormal   Collection Time: 09/05/20 11:25 AM  Result Value Ref Range   WBC 16.2 (H) 4.0 - 10.5 K/uL   RBC 3.67 (L) 3.87 - 5.11 MIL/uL   Hemoglobin 11.7 (L) 12.0 - 15.0 g/dL   HCT 35.2 (L) 36 - 46 %   MCV 95.9 80.0 - 100.0 fL   MCH 31.9 26.0 - 34.0 pg   MCHC 33.2 30.0 - 36.0 g/dL   RDW 12.3 11.5 - 15.5 %   Platelets 189 150 - 400 K/uL   nRBC 0.0 0.0 - 0.2 %   Neutrophils Relative % 81 %   Neutro Abs 13.2 (H) 1.7 - 7.7 K/uL   Lymphocytes Relative 4 %   Lymphs Abs 0.6 (L) 0.7 -  4.0 K/uL   Monocytes Relative 14 %   Monocytes Absolute 2.2 (H) 0.1 - 1.0 K/uL   Eosinophils Relative 0 %   Eosinophils Absolute 0.0 0.0 - 0.5 K/uL   Basophils Relative 0 %   Basophils Absolute 0.0 0.0 - 0.1 K/uL   Immature Granulocytes 1 %   Abs Immature Granulocytes 0.14 (H) 0.00 - 0.07 K/uL    Comment: Performed at [place] [address], [place], [place] [address]  Comprehensive metabolic panel     Status: Abnormal   Collection Time: 09/05/20 11:25 AM  Result Value Ref Range   Sodium 130 (L) 135 - 145 mmol/L   Potassium 3.3 (L) 3.5 - 5.1 mmol/L   Chloride 101 98 - 111 mmol/L   CO2 20 (L) 22 - 32 mmol/L   Glucose, Bld 121 (H) 70 - 99 mg/dL    Comment: Glucose reference range applies only to samples taken after fasting for at least 8 hours.   BUN <5 (L) 6 - 20 mg/dL   Creatinine, Ser 0.71 0.44 - 1.00 mg/dL   Calcium 8.2 (L) 8.9 - 10.3 mg/dL   Total Protein 5.7 (L) 6.5 - 8.1 g/dL   Albumin 2.6 (L) 3.5 - 5.0 g/dL   AST 19 15 - 41 U/L   ALT 22 0 - 44 U/L   Alkaline Phosphatase 45 38 - 126 U/L   Total Bilirubin 0.3 0.3 - 1.2 mg/dL   GFR, Estimated >60 >60 mL/min    Comment: (NOTE) Calculated using the CKD-EPI Creatinine Equation (2021)    Anion gap 9 5 - 15    Comment: Performed at [facility], [place] [address] [place], [place] [address]  Respiratory Panel by RT PCR (Flu A&B, Covid) - Nasopharyngeal Swab     Status: None   Collection Time: 09/05/20 12:16 PM   Specimen: Nasopharyngeal Swab; Nasopharyngeal(NP) swabs in vial transport medium  Result Value Ref Range   SARS Coronavirus 2 by RT PCR NEGATIVE NEGATIVE    Comment: (NOTE) SARS-CoV-2 target nucleic acids are NOT DETECTED.  The SARS-CoV-2 RNA is generally detectable in upper respiratoy specimens during the acute phase of infection. The lowest concentration of SARS-CoV-2 viral copies this  assay can detect is 131 copies/mL. A negative result does not preclude SARS-Cov-2 infection and should not be used  as the sole basis for treatment or other patient management decisions. A negative result may occur with  improper specimen collection/handling, submission of specimen other than nasopharyngeal swab, presence of viral mutation(s) within the areas targeted by this assay, and inadequate number of viral copies (<131 copies/mL). A negative result must be combined with clinical observations, patient history, and epidemiological information. The expected result is Negative.  Fact Sheet for Patients:  PinkCheek.be  Fact Sheet for Healthcare Providers:  GravelBags.it  This test is no t yet approved or cleared by the [place] FDA and  has been authorized for detection and/or diagnosis of SARS-CoV-2 by FDA under an Emergency Use Authorization (EUA). This EUA will remain  in effect (meaning this test can be used) for the duration of the COVID-19 declaration under Section 564(b)(1) of the Act, 21 U.S.C. section 360bbb-3(b)(1), unless the authorization is terminated or revoked sooner.     Influenza A by PCR NEGATIVE NEGATIVE   Influenza B by PCR NEGATIVE NEGATIVE    Comment: (NOTE) The Xpert Xpress SARS-CoV-2/FLU/RSV assay is intended as an aid in  the diagnosis of influenza from Nasopharyngeal swab specimens and  should not be used as a sole basis for treatment. Nasal washings and  aspirates are unacceptable for Xpert Xpress SARS-CoV-2/FLU/RSV  testing.  Fact Sheet for Patients: PinkCheek.be  Fact Sheet for Healthcare Providers: GravelBags.it  This test is not yet approved or cleared by the [place] FDA and  has been authorized for detection and/or diagnosis of SARS-CoV-2 by  FDA under an Emergency Use Authorization (EUA). This EUA will remain  in effect (meaning this test can be used) for the duration of the  Covid-19 declaration under Section 564(b)(1) of the  Act, 21  U.S.C. section 360bbb-3(b)(1), unless the authorization is  terminated or revoked. Performed at [facility], [place] [address], [place], [place] [address]   Type and screen [employer]     Status: None   Collection Time: 09/05/20 12:43 PM  Result Value Ref Range   ABO/RH(D) O POS    Antibody Screen NEG    Sample Expiration      09/08/2020,2359 Performed at [facility], [place] [address], [place],  [address]     Review of Systems  Constitutional: Positive for fever.  Respiratory: Negative for cough and shortness of breath.   Gastrointestinal: Negative for abdominal pain.  Genitourinary: Positive for dysuria and flank pain.  Musculoskeletal: Positive for back pain.   Physical Exam   Blood pressure 106/61, pulse (!) 106, temperature 98.1 F (36.7 C), temperature source Oral, resp. rate 18, last menstrual period 04/03/2020.  Physical Exam Vitals and nursing note reviewed.  Constitutional:      General: She is not in acute distress.    Appearance: She is ill-appearing. She is not toxic-appearing.  HENT:     Head: Normocephalic.  Cardiovascular:     Rate and Rhythm: Tachycardia present.     Heart sounds: Normal heart sounds.  Pulmonary:     Effort: Pulmonary effort is normal.     Breath sounds: Normal breath sounds.  Abdominal:     Palpations: Abdomen is soft.     Tenderness: There is no abdominal tenderness. There is right CVA tenderness.  Musculoskeletal:     Cervical back: Normal range of motion.  Neurological:     Mental Status: She  is alert and oriented to person, place, and time.  Psychiatric:        Behavior: Behavior normal.    MAU Course  Procedures  None   MDM  Urine culture pending Discussed admission with Dr. Rosana Hoes. Patient aware of plan of care.  Iv rocephin Q24 hours. O positive blood type.   Assessment and Plan   A:  1. Pyelonephritis affecting pregnancy in second trimester   2. [redacted] weeks gestation  of pregnancy     P:  Admit to [facility] specialty Urine culture pending Tylenol PRN   Aboubacar Matsuo, Artist Pais, NP 09/05/2020 1:33 PM

## 2020-09-05 NOTE — H&P (Signed)
Obstetric History and Physical  Sheri Walker is a 24 y.o. G2P0010 with IUP at 81w1dpresenting for fever, dark urine, back pain and dysuria for 3 days. Urine is dark and malodorous. Generally not feeling well. Thought she had a UTI.  Reports normal fetal movement. Denies leaking of fluid, denies vaginal bleeding. No pregnancy complaints. Had UTI last month that she took antibiotics for. No ongoing symptoms.    Prenatal Course Source of Care: [facility] Pregnancy complications or risks: Patient Active Problem List   Diagnosis Date Noted  . Pyelonephritis affecting pregnancy 09/05/2020  . Hyponatremia 09/05/2020  . Hypokalemia 09/05/2020  . UTI (urinary tract infection) in pregnancy in first trimester 07/02/2020  . Supervision of low-risk pregnancy 06/25/2020  . HSV-2 (herpes simplex virus 2) infection 2016  . Well child check 09/29/2012  . DERMATITIS, ATOPIC 07/22/2010  . ACNE VULGARIS, FACIAL 07/22/2010  . 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 ASTHMA, INTERMITTENT 12/10/2006   Medical History:  Past Medical History:  Diagnosis Date  . Acne   . Allergy   . Asthma   . HSV-2 (herpes simplex virus 2) infection 2016    History reviewed. No pertinent surgical history.  OB History  Gravida Para Term Preterm AB Living  2 0 0 0 1 0  SAB TAB Ectopic Multiple Live Births  0 1 0 0 0    # Outcome Date GA Lbr Len/2nd Weight Sex Delivery Anes PTL Lv  2 Current           1 TAB 10/17/19            Social History   Socioeconomic History  . Marital status: Significant Other    Spouse name: Not on file  . Number of children: Not on file  . Years of education: Not on file  . Highest education level: Not on file  Occupational History  . Not on file  Tobacco Use  . Smoking status: Never Smoker  Substance and Sexual Activity  . Alcohol use: Not Currently  . Drug use: Not Currently    Types: Marijuana  . Sexual activity: Yes    Birth control/protection: None   Other Topics Concern  . Not on file  Social History Narrative  . Not on file   Social Determinants of Health   Financial Resource Strain:   . Difficulty of Paying Living Expenses: Not on file  Food Insecurity:   . Worried About RCharity fundraiserin the Last Year: Not on file  . Ran Out of Food in the Last Year: Not on file  Transportation Needs:   . Lack of Transportation (Medical): Not on file  . Lack of Transportation (Non-Medical): Not on file  Physical Activity:   . Days of Exercise per Week: Not on file  . Minutes of Exercise per Session: Not on file  Stress:   . Feeling of Stress : Not on file  Social Connections:   . Frequency of Communication with Friends and Family: Not on file  . Frequency of Social Gatherings with Friends and Family: Not on file  . Attends Religious Services: Not on file  . Active Member of Clubs or Organizations: Not on file  . Attends CArchivistMeetings: Not on file  . Marital Status: Not on file    Family History  Problem Relation Age of Onset  . Allergies Mother   . Hyperlipidemia Father     Medications Prior to Admission  Medication Sig  Dispense Refill Last Dose  . acetaminophen (TYLENOL) 325 MG tablet Take 650 mg by mouth every 6 (six) hours as needed for moderate pain or fever.   09/04/2020 at 1500  . budesonide-formoterol (SYMBICORT) 80-4.5 MCG/ACT inhaler Inhale 2 puffs into the lungs 2 (two) times daily. 1 each 12 Past Month at Unknown time  . Prenatal Vit-Fe Fumarate-FA (MULTIVITAMIN-PRENATAL) 27-0.8 MG TABS tablet Take 1 tablet by mouth daily at 12 noon.   09/04/2020 at Unknown time  . valACYclovir (VALTREX) 1000 MG tablet Take 1 tablet (1,000 mg total) by mouth 2 (two) times daily. 10 tablet 5 Past Month at Unknown time  . Blood Pressure Monitoring (BLOOD PRESSURE KIT) DEVI 1 Device by Does not apply route daily. (Patient not taking: Reported on 07/23/2020) 1 each 0   . EPINEPHrine 0.3 mg/0.3 mL IJ SOAJ injection Inject  0.3 mg into the muscle as needed for anaphylaxis. 1 each 1 More than a month at Unknown time  . terconazole (TERAZOL 7) 0.4 % vaginal cream Place 1 applicator vaginally at bedtime. (Patient not taking: Reported on 07/23/2020) 45 g 0     Allergies  Allergen Reactions  . Peanut-Containing Drug Products Anaphylaxis    All Nuts     Review of Systems: Negative except for what is mentioned in HPI.  Physical Exam: BP (!) 104/58 (BP Location: Right Arm)   Pulse 92   Temp 98.2 F (36.8 C) (Oral)   Resp 18   Ht _0  (1.6 m)   Wt 64.6 kg   LMP 04/03/2020 (Exact Date)   SpO2 100%   BMI 25.23 kg/m  CONSTITUTIONAL: Well-developed, well-nourished female in no acute distress.  HENT:  Normocephalic, atraumatic, External right and left ear normal. Oropharynx is clear and moist EYES: Conjunctivae and EOM are normal. Pupils are equal, round, and reactive to light. No scleral icterus.  NECK: Normal range of motion, supple, no masses SKIN: Skin is warm and dry. No rash noted. Not diaphoretic. No erythema. No pallor. NEUROLOGIC: Alert and oriented to person, place, and time. Normal reflexes, muscle tone coordination. No cranial nerve deficit noted. PSYCHIATRIC: Normal mood and affect. Normal behavior. Normal judgment and thought content. CARDIOVASCULAR: Normal heart rate noted RESPIRATORY: Effort normal, no problems with respiration noted ABDOMEN: Soft, nontender, nondistended, gravid, mild back pain. MUSCULOSKELETAL: Normal range of motion. No edema and no tenderness. 2+ distal pulses.  FHT:  155 bpm    Pertinent Labs/Studies:   Results for orders placed or performed during the hospital encounter of 09/05/20 (from the past 24 hour(s))  Urinalysis, Routine w reflex microscopic Urine, Clean Catch     Status: Abnormal   Collection Time: 09/05/20 10:26 AM  Result Value Ref Range   Color, Urine YELLOW YELLOW   APPearance HAZY (A) CLEAR   Specific Gravity, Urine 1.004 (L) 1.005 - 1.030   pH 6.0  5.0 - 8.0   Glucose, UA 50 (A) NEGATIVE mg/dL   Hgb urine dipstick NEGATIVE NEGATIVE   Bilirubin Urine NEGATIVE NEGATIVE   Ketones, ur NEGATIVE NEGATIVE mg/dL   Protein, ur NEGATIVE NEGATIVE mg/dL   Nitrite NEGATIVE NEGATIVE   Leukocytes,Ua MODERATE (A) NEGATIVE   RBC / HPF 0-5 0 - 5 RBC/hpf   WBC, UA 11-20 0 - 5 WBC/hpf   Bacteria, UA MANY (A) NONE SEEN   Squamous Epithelial / LPF 0-5 0 - 5  CBC with Differential/Platelet     Status: Abnormal   Collection Time: 09/05/20 11:25 AM  Result Value Ref Range  WBC 16.2 (H) 4.0 - 10.5 K/uL   RBC 3.67 (L) 3.87 - 5.11 MIL/uL   Hemoglobin 11.7 (L) 12.0 - 15.0 g/dL   HCT 35.2 (L) 36 - 46 %   MCV 95.9 80.0 - 100.0 fL   MCH 31.9 26.0 - 34.0 pg   MCHC 33.2 30.0 - 36.0 g/dL   RDW 12.3 11.5 - 15.5 %   Platelets 189 150 - 400 K/uL   nRBC 0.0 0.0 - 0.2 %   Neutrophils Relative % 81 %   Neutro Abs 13.2 (H) 1.7 - 7.7 K/uL   Lymphocytes Relative 4 %   Lymphs Abs 0.6 (L) 0.7 - 4.0 K/uL   Monocytes Relative 14 %   Monocytes Absolute 2.2 (H) 0.1 - 1.0 K/uL   Eosinophils Relative 0 %   Eosinophils Absolute 0.0 0.0 - 0.5 K/uL   Basophils Relative 0 %   Basophils Absolute 0.0 0.0 - 0.1 K/uL   Immature Granulocytes 1 %   Abs Immature Granulocytes 0.14 (H) 0.00 - 0.07 K/uL  Comprehensive metabolic panel     Status: Abnormal   Collection Time: 09/05/20 11:25 AM  Result Value Ref Range   Sodium 130 (L) 135 - 145 mmol/L   Potassium 3.3 (L) 3.5 - 5.1 mmol/L   Chloride 101 98 - 111 mmol/L   CO2 20 (L) 22 - 32 mmol/L   Glucose, Bld 121 (H) 70 - 99 mg/dL   BUN <5 (L) 6 - 20 mg/dL   Creatinine, Ser 0.71 0.44 - 1.00 mg/dL   Calcium 8.2 (L) 8.9 - 10.3 mg/dL   Total Protein 5.7 (L) 6.5 - 8.1 g/dL   Albumin 2.6 (L) 3.5 - 5.0 g/dL   AST 19 15 - 41 U/L   ALT 22 0 - 44 U/L   Alkaline Phosphatase 45 38 - 126 U/L   Total Bilirubin 0.3 0.3 - 1.2 mg/dL   GFR, Estimated >60 >60 mL/min   Anion gap 9 5 - 15  Respiratory Panel by RT PCR (Flu A&B, Covid) -  Nasopharyngeal Swab     Status: None   Collection Time: 09/05/20 12:16 PM   Specimen: Nasopharyngeal Swab; Nasopharyngeal(NP) swabs in vial transport medium  Result Value Ref Range   SARS Coronavirus 2 by RT PCR NEGATIVE NEGATIVE   Influenza A by PCR NEGATIVE NEGATIVE   Influenza B by PCR NEGATIVE NEGATIVE  Type and screen [place]     Status: None   Collection Time: 09/05/20 12:43 PM  Result Value Ref Range   ABO/RH(D) O POS    Antibody Screen NEG    Sample Expiration      09/08/2020,2359 Performed at [facility], [address] [place], [place] [address]     Assessment : SHIVONNE SCHWARTZMAN is a 24 y.o. G2P0010 at 65w1dbeing admitted for presumed pyelonephritis. Urine culture pending. Reviewed plan with patient and her mother and she verbalizes understanding, is in agreement with plan.  Plan:  [place]onephritis - [place]- [place]ceph[place] - urine culture pending  Hyponatremia - Normal saline infusion  Hypokalemia - Kdur   FWB - daily doppler  Routine - regular diet - activity as tolerated    KFeliz Beam M.D. Attending Center for [employer](Faculty Practice)  09/05/2020, 2:52 PM

## 2020-09-05 NOTE — Progress Notes (Addendum)
Pt sent over from office. Pt reports fever w/ aches and chills for 4 days and dark colored urine. Pt reports a smell to urine and a white vaginal discharge with odor. Pt complains of right flank pain and lower back pain 7/10 with intermittent headache. Taking tylenol for pain and fever. Pt positive for UTI 9/13 and Tx and TOC 10/11 negative. No flu, strep, or covid test performed prior to admission. Pt reports vaccination from covid 12/2019 and flu recently. Pt vitals WNL, temp 98.1.   Pt mother support person in room and is not aware of her TAB or +HSV. Please do not disclose information.    Rally Ouch M Donabelle Molden RNC-OB

## 2020-09-06 DIAGNOSIS — O99282 Endocrine, nutritional and metabolic diseases complicating pregnancy, second trimester: Secondary | ICD-10-CM

## 2020-09-06 DIAGNOSIS — Z3A22 22 weeks gestation of pregnancy: Secondary | ICD-10-CM

## 2020-09-06 DIAGNOSIS — E876 Hypokalemia: Secondary | ICD-10-CM

## 2020-09-06 DIAGNOSIS — E871 Hypo-osmolality and hyponatremia: Secondary | ICD-10-CM

## 2020-09-06 LAB — CBC
HCT: 33.5 % — ABNORMAL LOW (ref 36.0–46.0)
Hemoglobin: 11.1 g/dL — ABNORMAL LOW (ref 12.0–15.0)
MCH: 32.1 pg (ref 26.0–34.0)
MCHC: 33.1 g/dL (ref 30.0–36.0)
MCV: 96.8 fL (ref 80.0–100.0)
Platelets: 182 10*3/uL (ref 150–400)
RBC: 3.46 MIL/uL — ABNORMAL LOW (ref 3.87–5.11)
RDW: 12.5 % (ref 11.5–15.5)
WBC: 13.1 10*3/uL — ABNORMAL HIGH (ref 4.0–10.5)
nRBC: 0 % (ref 0.0–0.2)

## 2020-09-06 LAB — BASIC METABOLIC PANEL
Anion gap: 9 (ref 5–15)
BUN: 6 mg/dL (ref 6–20)
CO2: 19 mmol/L — ABNORMAL LOW (ref 22–32)
Calcium: 7.9 mg/dL — ABNORMAL LOW (ref 8.9–10.3)
Chloride: 106 mmol/L (ref 98–111)
Creatinine, Ser: 0.55 mg/dL (ref 0.44–1.00)
GFR, Estimated: >60 mL/min (ref >60)
Glucose, Bld: 90 mg/dL (ref 70–99)
Potassium: 4.1 mmol/L (ref 3.5–5.1)
Sodium: 134 mmol/L — ABNORMAL LOW (ref 135–145)

## 2020-09-06 NOTE — Progress Notes (Signed)
FACULTY PRACTICE ANTEPARTUM COMPREHENSIVE PROGRESS NOTE  Sheri Walker is a 24 y.o. G2P0010 at [redacted]w[redacted]d who is admitted for pyelonephritis.  Estimated Date of Delivery: 01/08/21  Length of Stay:  1 Days. Admitted 09/05/2020  Subjective: Improved back pain, but had fever this morning. No nausea/vomiting.  Patient reports good fetal movement.  She reports no uterine contractions, no bleeding and no loss of fluid per vagina.  Vitals:  Blood pressure (!) 93/45, pulse (!) 106, temperature (!) 101.5 F (38.6 C), temperature source Oral, resp. rate 16, height 5\' 3"  (1.6 m), weight 64.6 kg, last menstrual period 04/03/2020, SpO2 98 %. Physical Examination: CONSTITUTIONAL: Well-developed, well-nourished female in no acute distress.  SKIN: Skin is warm and dry. No rash noted. Not diaphoretic. No erythema. No pallor. NEUROLGIC: Alert and oriented to person, place, and time. Normal reflexes, muscle tone coordination. No cranial nerve deficit noted. PSYCHIATRIC: Normal mood and affect. Normal behavior. Normal judgment and thought content. CARDIOVASCULAR: Normal heart rate noted, regular rhythm RESPIRATORY: Effort and breath sounds normal, no problems with respiration noted MUSCULOSKELETAL: Normal range of motion. No edema and no tenderness. ABDOMEN: Soft, nontender, nondistended, gravid. CERVIX:  Deferred  Fetal monitoring: FHR: 155 bpm on doppler   Results for orders placed or performed during the hospital encounter of 09/05/20 (from the past 48 hour(s))  Urinalysis, Routine w reflex microscopic Urine, Clean Catch     Status: Abnormal   Collection Time: 09/05/20 10:26 AM  Result Value Ref Range   Color, Urine YELLOW YELLOW   APPearance HAZY (A) CLEAR   Specific Gravity, Urine 1.004 (L) 1.005 - 1.030   pH 6.0 5.0 - 8.0   Glucose, UA 50 (A) NEGATIVE mg/dL   Hgb urine dipstick NEGATIVE NEGATIVE   Bilirubin Urine NEGATIVE NEGATIVE   Ketones, ur NEGATIVE NEGATIVE mg/dL   Protein, ur NEGATIVE  NEGATIVE mg/dL   Nitrite NEGATIVE NEGATIVE   Leukocytes,Ua MODERATE (A) NEGATIVE   RBC / HPF 0-5 0 - 5 RBC/hpf   WBC, UA 11-20 0 - 5 WBC/hpf   Bacteria, UA MANY (A) NONE SEEN   Squamous Epithelial / LPF 0-5 0 - 5    Comment: Performed at [facility], [address] [place], [place]  CBC with Differential/Platelet     Status: Abnormal   Collection Time: 09/05/20 11:25 AM  Result Value Ref Range   WBC 16.2 (H) 4.0 - 10.5 K/uL   RBC 3.67 (L) 3.87 - 5.11 MIL/uL   Hemoglobin 11.7 (L) 12.0 - 15.0 g/dL   HCT 09/07/20 (L) 36 - 46 %   MCV 95.9 80.0 - 100.0 fL   MCH 31.9 26.0 - 34.0 pg   MCHC 33.2 30.0 - 36.0 g/dL   RDW 60.4 54.0 - 98.1 %   Platelets 189 150 - 400 K/uL   nRBC 0.0 0.0 - 0.2 %   Neutrophils Relative % 81 %   Neutro Abs 13.2 (H) 1.7 - 7.7 K/uL   Lymphocytes Relative 4 %   Lymphs Abs 0.6 (L) 0.7 - 4.0 K/uL   Monocytes Relative 14 %   Monocytes Absolute 2.2 (H) 0.1 - 1.0 K/uL   Eosinophils Relative 0 %   Eosinophils Absolute 0.0 0.0 - 0.5 K/uL   Basophils Relative 0 %   Basophils Absolute 0.0 0.0 - 0.1 K/uL   Immature Granulocytes 1 %   Abs Immature Granulocytes 0.14 (H) 0.00 - 0.07 K/uL    Comment: Performed at [facility], [address],  [place], [place] [address]  Comprehensive metabolic panel     Status: Abnormal   Collection Time: 09/05/20 11:25 AM  Result Value Ref Range   Sodium 130 (L) 135 - 145 mmol/L   Potassium 3.3 (L) 3.5 - 5.1 mmol/L   Chloride 101 98 - 111 mmol/L   CO2 20 (L) 22 - 32 mmol/L   Glucose, Bld 121 (H) 70 - 99 mg/dL    Comment: Glucose reference range applies only to samples taken after fasting for at least 8 hours.   BUN <5 (L) 6 - 20 mg/dL   Creatinine, Ser 4.17 0.44 - 1.00 mg/dL   Calcium 8.2 (L) 8.9 - 10.3 mg/dL   Total Protein 5.7 (L) 6.5 - 8.1 g/dL   Albumin 2.6 (L) 3.5 - 5.0 g/dL   AST 19 15 - 41 U/L   ALT 22 0 - 44 U/L   Alkaline Phosphatase 45 38 - 126 U/L   Total Bilirubin 0.3 0.3 - 1.2 mg/dL   GFR,  Estimated >40 >81 mL/min    Comment: (NOTE) Calculated using the CKD-EPI Creatinine Equation (2021)    Anion gap 9 5 - 15    Comment: Performed at [facility], [address], [place], [place] [address]  Respiratory Panel by RT PCR (Flu A&B, Covid) - Nasopharyngeal Swab     Status: None   Collection Time: 09/05/20 12:16 PM   Specimen: Nasopharyngeal Swab; Nasopharyngeal(NP) swabs in vial transport medium  Result Value Ref Range   SARS Coronavirus 2 by RT PCR NEGATIVE NEGATIVE    Comment: (NOTE) SARS-CoV-2 target nucleic acids are NOT DETECTED.  The SARS-CoV-2 RNA is generally detectable in upper respiratoy specimens during the acute phase of infection. The lowest concentration of SARS-CoV-2 viral copies this assay can detect is 131 copies/mL. A negative result does not preclude SARS-Cov-2 infection and should not be used as the sole basis for treatment or other patient management decisions. A negative result may occur with  improper specimen collection/handling, submission of specimen other than nasopharyngeal swab, presence of viral mutation(s) within the areas targeted by this assay, and inadequate number of viral copies (<131 copies/mL). A negative result must be combined with clinical observations, patient history, and epidemiological information. The expected result is Negative.  Fact Sheet for Patients:  https://www.moore.com/  Fact Sheet for Healthcare Providers:  https://www.young.biz/  This test is no t yet approved or cleared by the [place] FDA and  has been authorized for detection and/or diagnosis of SARS-CoV-2 by FDA under an Emergency Use Authorization (EUA). This EUA will remain  in effect (meaning this test can be used) for the duration of the COVID-19 declaration under Section 564(b)(1) of the Act, 21 U.S.C. section 360bbb-3(b)(1), unless the authorization is terminated or revoked sooner.     Influenza A by  PCR NEGATIVE NEGATIVE   Influenza B by PCR NEGATIVE NEGATIVE    Comment: (NOTE) The Xpert Xpress SARS-CoV-2/FLU/RSV assay is intended as an aid in  the diagnosis of influenza from Nasopharyngeal swab specimens and  should not be used as a sole basis for treatment. Nasal washings and  aspirates are unacceptable for Xpert Xpress SARS-CoV-2/FLU/RSV  testing.  Fact Sheet for Patients: https://www.moore.com/  Fact Sheet for Healthcare Providers: https://www.young.biz/  This test is not yet approved or cleared by the [place] FDA and  has been authorized for detection and/or diagnosis of SARS-CoV-2 by  FDA under an Emergency Use Authorization (EUA). This EUA will remain  in effect (meaning this  test can be used) for the duration of the  Covid-19 declaration under Section 564(b)(1) of the Act, 21  U.S.C. section 360bbb-3(b)(1), unless the authorization is  terminated or revoked. Performed at [facility], [address] [place], [place] [address]   Type and screen [facility]     Status: None   Collection Time: 09/05/20 12:43 PM  Result Value Ref Range   ABO/RH(D) O POS    Antibody Screen NEG    Sample Expiration      09/08/2020,2359 Performed at [facility], [address], [place], [place] [address]   Basic metabolic panel     Status: Abnormal   Collection Time: 09/06/20  6:22 AM  Result Value Ref Range   Sodium 134 (L) 135 - 145 mmol/L   Potassium 4.1 3.5 - 5.1 mmol/L   Chloride 106 98 - 111 mmol/L   CO2 19 (L) 22 - 32 mmol/L   Glucose, Bld 90 70 - 99 mg/dL    Comment: Glucose reference range applies only to samples taken after fasting for at least 8 hours.   BUN 6 6 - 20 mg/dL   Creatinine, Ser 8.41 0.44 - 1.00 mg/dL   Calcium 7.9 (L) 8.9 - 10.3 mg/dL   GFR, Estimated >66 >06 mL/min    Comment: (NOTE) Calculated using the CKD-EPI Creatinine Equation (2021)    Anion gap 9 5 - 15    Comment:  Performed at [facility], [address], [place], [place] [address]  CBC     Status: Abnormal   Collection Time: 09/06/20  6:22 AM  Result Value Ref Range   WBC 13.1 (H) 4.0 - 10.5 K/uL   RBC 3.46 (L) 3.87 - 5.11 MIL/uL   Hemoglobin 11.1 (L) 12.0 - 15.0 g/dL   HCT 10.9 (L) 36 - 46 %   MCV 96.8 80.0 - 100.0 fL   MCH 32.1 26.0 - 34.0 pg   MCHC 33.1 30.0 - 36.0 g/dL   RDW 32.3 55.7 - 32.2 %   Platelets 182 150 - 400 K/uL   nRBC 0.0 0.0 - 0.2 %    Comment: Performed at [facility], [address] [place], [place] [address]    US RENAL  Result Date: 09/05/2020 CLINICAL DATA:  Pyelonephritis, [redacted] weeks pregnant. EXAM: RENAL / URINARY TRACT ULTRASOUND COMPLETE COMPARISON:  None. FINDINGS: Right Kidney: Renal measurements: 11.4 x 5.6 x 5.7 = volume: 190 to mL. Echogenicity within normal limits. No mass. No abscess formation. Mild hydronephrosis. Normal vascularity. Left Kidney: Renal measurements: 10.9 x 6.1 x 4.7 = volume: 161 mL. Echogenicity within normal limits. No mass or hydronephrosis visualized. No abscess formation. Normal vascularity. Bladder: Appears normal for degree of bladder distention. Bilateral ureteral jets are noted. Other: None. IMPRESSION: 1. Mild right hydronephrosis. 2. No ultrasound findings suggest pyelonephritis peer Electronically Signed   By: Tish Frederickson M.D.   On: 09/05/2020 17:00    Current scheduled medications . docusate sodium  100 mg Oral Daily  . potassium chloride  20 mEq Oral BID  . prenatal multivitamin  1 tablet Oral Q1200    I have reviewed the patient's current medications.  ASSESSMENT: Principal Problem:   Pyelonephritis affecting pregnancy in second trimester Active Problems:   Hyponatremia   Hypokalemia   [redacted] weeks gestation of pregnancy   PLAN: Last fever 101.5 this morning, blood cultures checked. Urine cultures pending.  Continue Rocephin for now, analgesia as needed Continue  close monitoring. No other acute  maternal-fetal concerns for now. Continue routine antenatal care.   Jaynie CollinsUGONNA  Marylou Wages, MD, FACOG Obstetrician & Gynecologist, [facility] Center for [facility], [facility]

## 2020-09-07 LAB — CBC
HCT: 31 % — ABNORMAL LOW (ref 36.0–46.0)
Hemoglobin: 10.4 g/dL — ABNORMAL LOW (ref 12.0–15.0)
MCH: 32.5 pg (ref 26.0–34.0)
MCHC: 33.5 g/dL (ref 30.0–36.0)
MCV: 96.9 fL (ref 80.0–100.0)
Platelets: 219 10*3/uL (ref 150–400)
RBC: 3.2 MIL/uL — ABNORMAL LOW (ref 3.87–5.11)
RDW: 12.6 % (ref 11.5–15.5)
WBC: 11.1 10*3/uL — ABNORMAL HIGH (ref 4.0–10.5)
nRBC: 0 % (ref 0.0–0.2)

## 2020-09-07 LAB — BASIC METABOLIC PANEL
Anion gap: 7 (ref 5–15)
BUN: 6 mg/dL (ref 6–20)
CO2: 18 mmol/L — ABNORMAL LOW (ref 22–32)
Calcium: 7.7 mg/dL — ABNORMAL LOW (ref 8.9–10.3)
Chloride: 105 mmol/L (ref 98–111)
Creatinine, Ser: 0.66 mg/dL (ref 0.44–1.00)
GFR, Estimated: >60 mL/min (ref >60)
Glucose, Bld: 80 mg/dL (ref 70–99)
Potassium: 3.7 mmol/L (ref 3.5–5.1)
Sodium: 130 mmol/L — ABNORMAL LOW (ref 135–145)

## 2020-09-07 LAB — CULTURE, OB URINE: Culture: >=100000 — AB

## 2020-09-07 NOTE — Progress Notes (Signed)
FACULTY PRACTICE ANTEPARTUM COMPREHENSIVE PROGRESS NOTE  Sheri Walker is a 24 y.o. G2P0010 at [redacted]w[redacted]d who is admitted for pyelonephritis.  Estimated Date of Delivery: 01/08/21  Length of Stay:  2 Days. Admitted 09/05/2020  Subjective: Feels overall better. Still has mild R sided back pain, and mild headache.  Patient reports good fetal movement.  She reports no uterine contractions, no bleeding and no loss of fluid per vagina.  Vitals:  Blood pressure (!) 104/54, pulse 98, temperature 98.2 F (36.8 C), temperature source Oral, resp. rate 18, height 5\' 3"  (1.6 m), weight 64.6 kg, last menstrual period 04/03/2020, SpO2 97 %. Physical Examination: CONSTITUTIONAL: Well-developed, well-nourished female in no acute distress.  SKIN: Skin is warm and dry. No rash noted. Not diaphoretic. No erythema. No pallor. NEUROLGIC: Alert and oriented to person, place, and time. Normal reflexes, muscle tone coordination. No cranial nerve deficit noted. PSYCHIATRIC: Normal mood and affect. Normal behavior. Normal judgment and thought content. CARDIOVASCULAR: Normal heart rate noted, regular rhythm RESPIRATORY: Effort and breath sounds normal, no problems with respiration noted MUSCULOSKELETAL: Normal range of motion. No edema and no tenderness. ABDOMEN: Soft, nontender, nondistended, gravid. CERVIX:  Deferred  Fetal monitoring: FHR: 155 bpm on doppler   Results for orders placed or performed during the hospital encounter of 09/05/20 (from the past 48 hour(s))  Urinalysis, Routine w reflex microscopic Urine, Clean Catch     Status: Abnormal   Collection Time: 09/05/20 10:26 AM  Result Value Ref Range   Color, Urine YELLOW YELLOW   APPearance HAZY (A) CLEAR   Specific Gravity, Urine 1.004 (L) 1.005 - 1.030   pH 6.0 5.0 - 8.0   Glucose, UA 50 (A) NEGATIVE mg/dL   Hgb urine dipstick NEGATIVE NEGATIVE   Bilirubin Urine NEGATIVE NEGATIVE   Ketones, ur NEGATIVE NEGATIVE mg/dL   Protein, ur NEGATIVE  NEGATIVE mg/dL   Nitrite NEGATIVE NEGATIVE   Leukocytes,Ua MODERATE (A) NEGATIVE   RBC / HPF 0-5 0 - 5 RBC/hpf   WBC, UA 11-20 0 - 5 WBC/hpf   Bacteria, UA MANY (A) NONE SEEN   Squamous Epithelial / LPF 0-5 0 - 5    Comment: Performed at [facility], [address], [place], [place]  OB Urine Culture     Status: Abnormal (Preliminary result)   Collection Time: 09/05/20 10:26 AM   Specimen: OB Clean Catch; Urine  Result Value Ref Range   Specimen Description OB CLEAN CATCH    Special Requests NONE    Culture (A)     >=100,000 COLONIES/mL ESCHERICHIA COLI SUSCEPTIBILITIES TO FOLLOW Performed at [facility], [address], [place], [place]    Report Status PENDING   CBC with Differential/Platelet     Status: Abnormal   Collection Time: 09/05/20 11:25 AM  Result Value Ref Range   WBC 16.2 (H) 4.0 - 10.5 K/uL   RBC 3.67 (L) 3.87 - 5.11 MIL/uL   Hemoglobin 11.7 (L) 12.0 - 15.0 g/dL   HCT 09/07/20 (L) 36 - 46 %   MCV 95.9 80.0 - 100.0 fL   MCH 31.9 26.0 - 34.0 pg   MCHC 33.2 30.0 - 36.0 g/dL   RDW 32.4 40.1 - 02.7 %   Platelets 189 150 - 400 K/uL   nRBC 0.0 0.0 - 0.2 %   Neutrophils Relative % 81 %   Neutro Abs 13.2 (H) 1.7 - 7.7 K/uL   Lymphocytes Relative 4 %   Lymphs Abs 0.6 (L)  0.7 - 4.0 K/uL   Monocytes Relative 14 %   Monocytes Absolute 2.2 (H) 0.1 - 1.0 K/uL   Eosinophils Relative 0 %   Eosinophils Absolute 0.0 0.0 - 0.5 K/uL   Basophils Relative 0 %   Basophils Absolute 0.0 0.0 - 0.1 K/uL   Immature Granulocytes 1 %   Abs Immature Granulocytes 0.14 (H) 0.00 - 0.07 K/uL    Comment: Performed at [facility], [address] [place], [place] [address]  Comprehensive metabolic panel     Status: Abnormal   Collection Time: 09/05/20 11:25 AM  Result Value Ref Range   Sodium 130 (L) 135 - 145 mmol/L   Potassium 3.3 (L) 3.5 - 5.1 mmol/L   Chloride 101 98 - 111 mmol/L   CO2 20 (L) 22 - 32 mmol/L   Glucose, Bld 121 (H) 70 - 99 mg/dL     Comment: Glucose reference range applies only to samples taken after fasting for at least 8 hours.   BUN <5 (L) 6 - 20 mg/dL   Creatinine, Ser 7.82 0.44 - 1.00 mg/dL   Calcium 8.2 (L) 8.9 - 10.3 mg/dL   Total Protein 5.7 (L) 6.5 - 8.1 g/dL   Albumin 2.6 (L) 3.5 - 5.0 g/dL   AST 19 15 - 41 U/L   ALT 22 0 - 44 U/L   Alkaline Phosphatase 45 38 - 126 U/L   Total Bilirubin 0.3 0.3 - 1.2 mg/dL   GFR, Estimated >95 >62 mL/min    Comment: (NOTE) Calculated using the CKD-EPI Creatinine Equation (2021)    Anion gap 9 5 - 15    Comment: Performed at [facility], [address] [place], [place] [address]  Respiratory Panel by RT PCR (Flu A&B, Covid) - Nasopharyngeal Swab     Status: None   Collection Time: 09/05/20 12:16 PM   Specimen: Nasopharyngeal Swab; Nasopharyngeal(NP) swabs in vial transport medium  Result Value Ref Range   SARS Coronavirus 2 by RT PCR NEGATIVE NEGATIVE    Comment: (NOTE) SARS-CoV-2 target nucleic acids are NOT DETECTED.  The SARS-CoV-2 RNA is generally detectable in upper respiratoy specimens during the acute phase of infection. The lowest concentration of SARS-CoV-2 viral copies this assay can detect is 131 copies/mL. A negative result does not preclude SARS-Cov-2 infection and should not be used as the sole basis for treatment or other patient management decisions. A negative result may occur with  improper specimen collection/handling, submission of specimen other than nasopharyngeal swab, presence of viral mutation(s) within the areas targeted by this assay, and inadequate number of viral copies (<131 copies/mL). A negative result must be combined with clinical observations, patient history, and epidemiological information. The expected result is Negative.  Fact Sheet for Patients:  https://www.moore.com/  Fact Sheet for Healthcare Providers:  https://www.young.biz/  This test is no t yet approved or cleared by  the [place] FDA and  has been authorized for detection and/or diagnosis of SARS-CoV-2 by FDA under an Emergency Use Authorization (EUA). This EUA will remain  in effect (meaning this test can be used) for the duration of the COVID-19 declaration under Section 564(b)(1) of the Act, 21 U.S.C. section 360bbb-3(b)(1), unless the authorization is terminated or revoked sooner.     Influenza A by PCR NEGATIVE NEGATIVE   Influenza B by PCR NEGATIVE NEGATIVE    Comment: (NOTE) The Xpert Xpress SARS-CoV-2/FLU/RSV assay is intended as an aid in  the diagnosis of influenza from Nasopharyngeal swab specimens  and  should not be used as a sole basis for treatment. Nasal washings and  aspirates are unacceptable for Xpert Xpress SARS-CoV-2/FLU/RSV  testing.  Fact Sheet for Patients: https://www.moore.com/  Fact Sheet for Healthcare Providers: https://www.young.biz/  This test is not yet approved or cleared by the [place] FDA and  has been authorized for detection and/or diagnosis of SARS-CoV-2 by  FDA under an Emergency Use Authorization (EUA). This EUA will remain  in effect (meaning this test can be used) for the duration of the  Covid-19 declaration under Section 564(b)(1) of the Act, 21  U.S.C. section 360bbb-3(b)(1), unless the authorization is  terminated or revoked. Performed at [facility], [address], [place], [place] [address]   Type and screen [facility]     Status: None   Collection Time: 09/05/20 12:43 PM  Result Value Ref Range   ABO/RH(D) O POS    Antibody Screen NEG    Sample Expiration      09/08/2020,2359 Performed at [facility], [address] [place], [place] [address]   Basic metabolic panel     Status: Abnormal   Collection Time: 09/06/20  6:22 AM  Result Value Ref Range   Sodium 134 (L) 135 - 145 mmol/L   Potassium 4.1 3.5 - 5.1 mmol/L   Chloride 106 98 - 111 mmol/L   CO2 19  (L) 22 - 32 mmol/L   Glucose, Bld 90 70 - 99 mg/dL    Comment: Glucose reference range applies only to samples taken after fasting for at least 8 hours.   BUN 6 6 - 20 mg/dL   Creatinine, Ser 7.62 0.44 - 1.00 mg/dL   Calcium 7.9 (L) 8.9 - 10.3 mg/dL   GFR, Estimated >26 >33 mL/min    Comment: (NOTE) Calculated using the CKD-EPI Creatinine Equation (2021)    Anion gap 9 5 - 15    Comment: Performed at [facility], [address] [place], [place] [address]  CBC     Status: Abnormal   Collection Time: 09/06/20  6:22 AM  Result Value Ref Range   WBC 13.1 (H) 4.0 - 10.5 K/uL   RBC 3.46 (L) 3.87 - 5.11 MIL/uL   Hemoglobin 11.1 (L) 12.0 - 15.0 g/dL   HCT 25.6 (L) 36 - 46 %   MCV 96.8 80.0 - 100.0 fL   MCH 32.1 26.0 - 34.0 pg   MCHC 33.1 30.0 - 36.0 g/dL   RDW 38.9 37.3 - 42.8 %   Platelets 182 150 - 400 K/uL   nRBC 0.0 0.0 - 0.2 %    Comment: Performed at [facility], [address], [place], [place] [address]    US RENAL  Result Date: 09/05/2020 CLINICAL DATA:  Pyelonephritis, [redacted] weeks pregnant. EXAM: RENAL / URINARY TRACT ULTRASOUND COMPLETE COMPARISON:  None. FINDINGS: Right Kidney: Renal measurements: 11.4 x 5.6 x 5.7 = volume: 190 to mL. Echogenicity within normal limits. No mass. No abscess formation. Mild hydronephrosis. Normal vascularity. Left Kidney: Renal measurements: 10.9 x 6.1 x 4.7 = volume: 161 mL. Echogenicity within normal limits. No mass or hydronephrosis visualized. No abscess formation. Normal vascularity. Bladder: Appears normal for degree of bladder distention. Bilateral ureteral jets are noted. Other: None. IMPRESSION: 1. Mild right hydronephrosis. 2. No ultrasound findings suggest pyelonephritis peer Electronically Signed   By: Tish Frederickson M.D.   On: 09/05/2020 17:00    Current scheduled medications . docusate sodium  100  mg Oral Daily  . potassium chloride  20 mEq Oral BID  . prenatal multivitamin  1 tablet Oral Q1200    I have reviewed  the patient's current medications.  ASSESSMENT: Principal Problem:   Pyelonephritis affecting pregnancy in second trimester Active Problems:   Hyponatremia   Hypokalemia   [redacted] weeks gestation of pregnancy   PLAN: Last fever 101.5 at 0845 11/25, blood cultures pending. Urine cultures showed E.coli, sensitivities pending.  Continue Rocephin for now, analgesia as needed Follow up labs as ordered and replete electrolytes as needed No other acute maternal-fetal concerns for now. Continue close monitoring androutine antenatal care.   Jaynie CollinsUGONNA  Remy Voiles, MD, FACOG Obstetrician & Gynecologist, [facility], [facility]

## 2020-09-08 ENCOUNTER — Encounter (HOSPITAL_COMMUNITY): Payer: Self-pay | Admitting: Obstetrics and Gynecology

## 2020-09-08 DIAGNOSIS — N12 Tubulo-interstitial nephritis, not specified as acute or chronic: Secondary | ICD-10-CM

## 2020-09-08 LAB — CBC
HCT: 31.4 % — ABNORMAL LOW (ref 36.0–46.0)
Hemoglobin: 10.3 g/dL — ABNORMAL LOW (ref 12.0–15.0)
MCH: 31.8 pg (ref 26.0–34.0)
MCHC: 32.8 g/dL (ref 30.0–36.0)
MCV: 96.9 fL (ref 80.0–100.0)
Platelets: 220 10*3/uL (ref 150–400)
RBC: 3.24 MIL/uL — ABNORMAL LOW (ref 3.87–5.11)
RDW: 12.8 % (ref 11.5–15.5)
WBC: 8.5 10*3/uL (ref 4.0–10.5)
nRBC: 0 % (ref 0.0–0.2)

## 2020-09-08 LAB — BASIC METABOLIC PANEL
Anion gap: 6 (ref 5–15)
BUN: 7 mg/dL (ref 6–20)
CO2: 22 mmol/L (ref 22–32)
Calcium: 8.1 mg/dL — ABNORMAL LOW (ref 8.9–10.3)
Chloride: 108 mmol/L (ref 98–111)
Creatinine, Ser: 0.51 mg/dL (ref 0.44–1.00)
GFR, Estimated: >60 mL/min (ref >60)
Glucose, Bld: 85 mg/dL (ref 70–99)
Potassium: 4.2 mmol/L (ref 3.5–5.1)
Sodium: 136 mmol/L (ref 135–145)

## 2020-09-08 MED ORDER — CEFADROXIL 500 MG PO CAPS: 500.0000 mg | ORAL_CAPSULE | Freq: Two times a day (BID) | ORAL | 0 refills | Status: AC

## 2020-09-08 MED ORDER — NITROFURANTOIN MONOHYD MACRO 100 MG PO CAPS: 100.0000 mg | ORAL_CAPSULE | Freq: Every day | ORAL | 3 refills | Status: DC

## 2020-09-08 MED ORDER — SODIUM CHLORIDE 0.9 % IV SOLN
2.0000 g | INTRAVENOUS | Status: AC
  Administered 2020-09-08: 2 g via INTRAVENOUS
  Filled 2020-09-08: qty 20

## 2020-09-08 NOTE — Discharge Instructions (Signed)
Pyelonephritis, Adult  Pyelonephritis is an infection that occurs in the kidney. The kidneys are organs that help clean the blood by moving waste out of the blood and into the pee (urine). This infection can happen quickly, or it can last for a long time. In most cases, it clears up with treatment and does not cause other problems. What are the causes? This condition may be caused by:  Germs (bacteria) going from the bladder up to the kidney. This may happen after having a bladder infection.  Germs going from the blood to the kidney. What increases the risk? This condition is more likely to develop in:  Pregnant women.  Older people.  People who have any of these conditions: ? Diabetes. ? Inflammation of the prostate gland (prostatitis), in males. ? Kidney stones or bladder stones. ? Other problems with the kidney or the parts of your body that carry pee from the kidneys to the bladder (ureters). ? Cancer.  People who have a small, thin tube (catheter) placed in the bladder.  People who are sexually active.  Women who use a medicine that kills sperm (spermicide) to prevent pregnancy.  People who have had a prior urinary tract infection (UTI). What are the signs or symptoms? Symptoms of this condition include:  Peeing often.  A strong urge to pee right away.  Burning or stinging when peeing.  Belly pain.  Back pain.  Pain in the side (flank area).  Fever or chills.  Blood in the pee, or dark pee.  Feeling sick to your stomach (nauseous) or throwing up (vomiting). How is this treated? This condition may be treated by:  Taking antibiotic medicines by mouth (orally).  Drinking enough fluids. If the infection is bad, you may need to stay in the hospital. You may be given antibiotics and fluids that are put directly into a vein through an IV tube. In some cases, other treatments may be needed. Follow these instructions at home: Medicines  Take your antibiotic  medicine as told by your doctor. Do not stop taking the antibiotic even if you start to feel better.  Take over-the-counter and prescription medicines only as told by your doctor. General instructions   Drink enough fluid to keep your pee pale yellow.  Avoid caffeine, tea, and carbonated drinks.  Pee (urinate) often. Avoid holding in pee for long periods of time.  Pee before and after sex.  After pooping (having a bowel movement), women should wipe from front to back. Use each tissue only once.  Keep all follow-up visits as told by your doctor. This is important. Contact a doctor if:  You do not feel better after 2 days.  Your symptoms get worse.  You have a fever. Get help right away if:  You cannot take your medicine or drink fluids as told.  You have chills and shaking.  You throw up.  You have very bad pain in your side or back.  You feel very weak or you pass out (faint). Summary  Pyelonephritis is an infection that occurs in the kidney.  In most cases, this infection clears up with treatment and does not cause other problems.  Take your antibiotic medicine as told by your doctor. Do not stop taking the antibiotic even if you start to feel better.  Drink enough fluid to keep your pee pale yellow. This information is not intended to replace advice given to you by your health care provider. Make sure you discuss any questions you have with   your health care provider. Document Revised: 08/03/2018 Document Reviewed: 08/03/2018 Elsevier Patient Education  2020 [employer]  

## 2020-09-08 NOTE — Discharge Summary (Signed)
Antenatal Physician Discharge Summary  Patient ID: Sheri Walker MRN: [id number] DOB/AGE: [birth year] 25 y.o.  Admit date: 09/05/2020 Discharge date: 09/08/2020  Admission and Discharge Diagnoses:  Principal Problem:   Pyelonephritis affecting pregnancy in second trimester Active Problems:   Hyponatremia   Hypokalemia   [redacted] weeks gestation of pregnancy  Prenatal Procedures: None  Consults: None  Hospital Course:  Sheri Walker is a 24 y.o. G2P0010 with IUP at 59w4dadmitted for Pyelonephritis.  Started on Rocephin. Negative renal ultrasound.  Urine culture grew E.coli, pan-sensitive. She was afebrile x 48 hours prior to discharge. She was deemed stable for discharge to home with outpatient follow up.  Discharge Exam: Temp:  [97.3 F (36.3 C)-98.2 F (36.8 C)] 98 F (36.7 C) (11/27 0800) Pulse Rate:  [74-94] 79 (11/27 0800) Resp:  [16-18] 17 (11/27 0800) BP: (97-107)/(55-64) 101/60 (11/27 0800) SpO2:  [98 %-100 %] 100 % (11/27 0800) Physical Examination: CONSTITUTIONAL: Well-developed, well-nourished female in no acute distress.  HENT:  Normocephalic, atraumatic, External right and left ear normal.  EYES: Conjunctivae and EOM are normal.  No scleral icterus.  NECK: Normal range of motion, supple, no masses SKIN: Skin is warm and dry. No rash noted.  [place] Alert and oriented to person, place, and time.  PSYCHIATRIC: Normal mood and affect.  Normal judgment and thought content. CARDIOVASCULAR: Normal heart rate noted, regular rhythm RESPIRATORY: Effort and breath sounds normal, no problems with respiration noted MUSCULOSKELETAL: Normal range of motion. No edema and no tenderness. 2+ distal pulses. ABDOMEN: Soft, nontender, nondistended, gravid.   Significant Diagnostic Studies:  Results for orders placed or performed during the hospital encounter of 09/05/20 (from the past 168 hour(s))  OB Urine Culture   Collection Time: 09/05/20 10:26 AM   Specimen: OB  Clean Catch; Urine  Result Value Ref Range   Specimen Description OB CLEAN CATCH    Special Requests NONE    Culture (A)     >=100,000 COLONIES/mL ESCHERICHIA COLI NO GROUP B STREP (S.AGALACTIAE) ISOLATED Performed at [facility], [place] [address]   Report Status 09/07/2020 FINAL    Organism ID, Bacteria ESCHERICHIA COLI (A)       Susceptibility   Escherichia coli - MIC*    AMPICILLIN <=2 SENSITIVE Sensitive     CEFAZOLIN <=4 SENSITIVE Sensitive     CEFEPIME <=0.12 SENSITIVE Sensitive     CEFTAZIDIME <=1 SENSITIVE Sensitive     CEFTRIAXONE <=0.25 SENSITIVE Sensitive     CIPROFLOXACIN <=0.25 SENSITIVE Sensitive     GENTAMICIN <=1 SENSITIVE Sensitive     IMIPENEM <=0.25 SENSITIVE Sensitive     TRIMETH/SULFA <=20 SENSITIVE Sensitive     AMPICILLIN/SULBACTAM <=2 SENSITIVE Sensitive     PIP/TAZO <=4 SENSITIVE Sensitive     * >=100,000 COLONIES/mL ESCHERICHIA COLI  Urinalysis, Routine w reflex microscopic Urine, Clean Catch   Collection Time: 09/05/20 10:26 AM  Result Value Ref Range   Color, Urine YELLOW YELLOW   APPearance HAZY (A) CLEAR   Specific Gravity, Urine 1.004 (L) 1.005 - 1.030   pH 6.0 5.0 - 8.0   Glucose, UA 50 (A) NEGATIVE mg/dL   Hgb urine dipstick NEGATIVE NEGATIVE   Bilirubin Urine NEGATIVE NEGATIVE   Ketones, ur NEGATIVE NEGATIVE mg/dL   Protein, ur NEGATIVE NEGATIVE mg/dL   Nitrite NEGATIVE NEGATIVE   Leukocytes,Ua MODERATE (A) NEGATIVE   RBC / HPF 0-5 0 - 5 RBC/hpf   WBC, UA 11-20 0 - 5 WBC/hpf  Bacteria, UA MANY (A) NONE SEEN   Squamous Epithelial / LPF 0-5 0 - 5  CBC with Differential/Platelet   Collection Time: 09/05/20 11:25 AM  Result Value Ref Range   WBC 16.2 (H) 4.0 - 10.5 K/uL   RBC 3.67 (L) 3.87 - 5.11 MIL/uL   Hemoglobin 11.7 (L) 12.0 - 15.0 g/dL   HCT 35.2 (L) 36 - 46 %   MCV 95.9 80.0 - 100.0 fL   MCH 31.9 26.0 - 34.0 pg   MCHC 33.2 30.0 - 36.0 g/dL   RDW 12.3 11.5 - 15.5 %   Platelets 189 150 - 400  K/uL   nRBC 0.0 0.0 - 0.2 %   Neutrophils Relative % 81 %   Neutro Abs 13.2 (H) 1.7 - 7.7 K/uL   Lymphocytes Relative 4 %   Lymphs Abs 0.6 (L) 0.7 - 4.0 K/uL   Monocytes Relative 14 %   Monocytes Absolute 2.2 (H) 0.1 - 1.0 K/uL   Eosinophils Relative 0 %   Eosinophils Absolute 0.0 0.0 - 0.5 K/uL   Basophils Relative 0 %   Basophils Absolute 0.0 0.0 - 0.1 K/uL   Immature Granulocytes 1 %   Abs Immature Granulocytes 0.14 (H) 0.00 - 0.07 K/uL  Comprehensive metabolic panel   Collection Time: 09/05/20 11:25 AM  Result Value Ref Range   Sodium 130 (L) 135 - 145 mmol/L   Potassium 3.3 (L) 3.5 - 5.1 mmol/L   Chloride 101 98 - 111 mmol/L   CO2 20 (L) 22 - 32 mmol/L   Glucose, Bld 121 (H) 70 - 99 mg/dL   BUN <5 (L) 6 - 20 mg/dL   Creatinine, Ser 0.71 0.44 - 1.00 mg/dL   Calcium 8.2 (L) 8.9 - 10.3 mg/dL   Total Protein 5.7 (L) 6.5 - 8.1 g/dL   Albumin 2.6 (L) 3.5 - 5.0 g/dL   AST 19 15 - 41 U/L   ALT 22 0 - 44 U/L   Alkaline Phosphatase 45 38 - 126 U/L   Total Bilirubin 0.3 0.3 - 1.2 mg/dL   GFR, Estimated >60 >60 mL/min   Anion gap 9 5 - 15  Respiratory Panel by RT PCR (Flu A&B, Covid) - Nasopharyngeal Swab   Collection Time: 09/05/20 12:16 PM   Specimen: Nasopharyngeal Swab; Nasopharyngeal(NP) swabs in vial transport medium  Result Value Ref Range   SARS Coronavirus 2 by RT PCR NEGATIVE NEGATIVE   Influenza A by PCR NEGATIVE NEGATIVE   Influenza B by PCR NEGATIVE NEGATIVE  Type and screen [place]   Collection Time: 09/05/20 12:43 PM  Result Value Ref Range   ABO/RH(D) O POS    Antibody Screen NEG    Sample Expiration      09/08/2020,2359 Performed at [facility], [address], [place], [place] [address]   Basic metabolic panel   Collection Time: 09/06/20  6:22 AM  Result Value Ref Range   Sodium 134 (L) 135 - 145 mmol/L   Potassium 4.1 3.5 - 5.1 mmol/L   Chloride 106 98 - 111 mmol/L   CO2 19 (L) 22 - 32 mmol/L   Glucose, Bld 90 70 - 99  mg/dL   BUN 6 6 - 20 mg/dL   Creatinine, Ser 0.55 0.44 - 1.00 mg/dL   Calcium 7.9 (L) 8.9 - 10.3 mg/dL   GFR, Estimated >60 >60 mL/min   Anion gap 9 5 - 15  CBC   Collection Time: 09/06/20  6:22 AM  Result  Value Ref Range   WBC 13.1 (H) 4.0 - 10.5 K/uL   RBC 3.46 (L) 3.87 - 5.11 MIL/uL   Hemoglobin 11.1 (L) 12.0 - 15.0 g/dL   HCT 33.5 (L) 36 - 46 %   MCV 96.8 80.0 - 100.0 fL   MCH 32.1 26.0 - 34.0 pg   MCHC 33.1 30.0 - 36.0 g/dL   RDW 12.5 11.5 - 15.5 %   Platelets 182 150 - 400 K/uL   nRBC 0.0 0.0 - 0.2 %  Culture, blood (routine x 2)   Collection Time: 09/06/20  9:26 AM   Specimen: BLOOD  Result Value Ref Range   Specimen Description BLOOD RIGHT ANTECUBITAL    Special Requests      BOTTLES DRAWN AEROBIC AND ANAEROBIC Blood Culture adequate volume   Culture      NO GROWTH 2 DAYS Performed at [facility], [address] [place], [place] [address]    Report Status PENDING   Culture, blood (routine x 2)   Collection Time: 09/06/20  9:29 AM   Specimen: BLOOD  Result Value Ref Range   Specimen Description BLOOD LEFT ANTECUBITAL    Special Requests      BOTTLES DRAWN AEROBIC AND ANAEROBIC Blood Culture adequate volume   Culture      NO GROWTH 2 DAYS Performed at [facility], [place] [address], [place], [place] [address]    Report Status PENDING   Basic metabolic panel   Collection Time: 09/07/20  5:56 AM  Result Value Ref Range   Sodium 130 (L) 135 - 145 mmol/L   Potassium 3.7 3.5 - 5.1 mmol/L   Chloride 105 98 - 111 mmol/L   CO2 18 (L) 22 - 32 mmol/L   Glucose, Bld 80 70 - 99 mg/dL   BUN 6 6 - 20 mg/dL   Creatinine, Ser 0.66 0.44 - 1.00 mg/dL   Calcium 7.7 (L) 8.9 - 10.3 mg/dL   GFR, Estimated >60 >60 mL/min   Anion gap 7 5 - 15  CBC   Collection Time: 09/07/20  5:56 AM  Result Value Ref Range   WBC 11.1 (H) 4.0 - 10.5 K/uL   RBC 3.20 (L) 3.87 - 5.11 MIL/uL   Hemoglobin 10.4 (L) 12.0 - 15.0 g/dL   HCT 31.0 (L) 36 - 46 %   MCV 96.9 80.0 - 100.0 fL    MCH 32.5 26.0 - 34.0 pg   MCHC 33.5 30.0 - 36.0 g/dL   RDW 12.6 11.5 - 15.5 %   Platelets 219 150 - 400 K/uL   nRBC 0.0 0.0 - 0.2 %  Basic metabolic panel   Collection Time: 09/08/20  3:57 AM  Result Value Ref Range   Sodium 136 135 - 145 mmol/L   Potassium 4.2 3.5 - 5.1 mmol/L   Chloride 108 98 - 111 mmol/L   CO2 22 22 - 32 mmol/L   Glucose, Bld 85 70 - 99 mg/dL   BUN 7 6 - 20 mg/dL   Creatinine, Ser 0.51 0.44 - 1.00 mg/dL   Calcium 8.1 (L) 8.9 - 10.3 mg/dL   GFR, Estimated >60 >60 mL/min   Anion gap 6 5 - 15  CBC   Collection Time: 09/08/20  3:57 AM  Result Value Ref Range   WBC 8.5 4.0 - 10.5 K/uL   RBC 3.24 (L) 3.87 - 5.11 MIL/uL   Hemoglobin 10.3 (L) 12.0 - 15.0 g/dL   HCT 31.4 (L) 36 - 46 %  MCV 96.9 80.0 - 100.0 fL   MCH 31.8 26.0 - 34.0 pg   MCHC 32.8 30.0 - 36.0 g/dL   RDW 12.8 11.5 - 15.5 %   Platelets 220 150 - 400 K/uL   nRBC 0.0 0.0 - 0.2 %   US RENAL  Result Date: 09/05/2020 CLINICAL DATA:  Pyelonephritis, [redacted] weeks pregnant. EXAM: RENAL / URINARY TRACT ULTRASOUND COMPLETE COMPARISON:  None. FINDINGS: Right Kidney: Renal measurements: 11.4 x 5.6 x 5.7 = volume: 190 to mL. Echogenicity within normal limits. No mass. No abscess formation. Mild hydronephrosis. Normal vascularity. Left Kidney: Renal measurements: 10.9 x 6.1 x 4.7 = volume: 161 mL. Echogenicity within normal limits. No mass or hydronephrosis visualized. No abscess formation. Normal vascularity. Bladder: Appears normal for degree of bladder distention. Bilateral ureteral jets are noted. Other: None. IMPRESSION: 1. Mild right hydronephrosis. 2. No ultrasound findings suggest pyelonephritis peer Electronically Signed   By: Iven Finn M.D.   On: 09/05/2020 17:00   [place] MFM OB COMP + 14 WK  Result Date: 08/20/2020 ----------------------------------------------------------------------  OBSTETRICS REPORT                       (Signed Final 08/20/2020 09:47 am)  ---------------------------------------------------------------------- Patient Info  ID #:       [id number]                          D.O.B.:  02-26-1996 (24 yrs)  Name:       Arvella Nigh              Visit Date: 08/20/2020 08:54 am ---------------------------------------------------------------------- Performed By  Attending:        Tama High MD        Ref. Address:     [address]                                                             [place], [place]  Performed By:     Valda Favia          [facility]                                                             [place] [facility]  Referred By:      [facility]                    for Women ---------------------------------------------------------------------- Orders  #  Description                           Code        Ordered By  1  [place] MFM OB COMP + 14 WK                [id number]    Maye Hides ----------------------------------------------------------------------  #  Order #                     Accession #                Episode #  1  [id number]                    [id number]                 [id number] ---------------------------------------------------------------------- Indications  [redacted] weeks gestation of pregnancy                Z3A.19  Encounter for antenatal screening for          Z36.3  malformations  Neg AFP ---------------------------------------------------------------------- Fetal Evaluation  Num Of Fetuses:         1  Fetal Heart Rate(bpm):  143  Cardiac Activity:       Observed  Presentation:           Cephalic  Placenta:               Posterior Fundal  P. Cord Insertion:      Visualized, central  Amniotic Fluid  AFI FV:      Within normal limits                              Largest Pocket(cm)                               5.02 ---------------------------------------------------------------------- Biometry  BPD:      46.2  mm     G. Age:  20w 0d         55  %    CI:        75.35   %    70 - 86                                                          FL/HC:      18.5   %    16.8 - 19.8  HC:      168.8  mm     G. Age:  19w 4d         27  %    HC/AC:      1.07        1.09 - 1.39  AC:      157.9  mm     G. Age:  20w 6d         78  %    FL/BPD:  67.7   %  FL:       31.3  mm     G. Age:  19w 5d         37  %    FL/AC:      19.8   %    20 - 24  HUM:      27.9  mm     G. Age:  19w 0d         30  %  CER:      20.8  mm     G. Age:  19w 6d         69  %  CM:        6.3  mm  Est. FW:     343  gm    0 lb 12 oz      70  % ---------------------------------------------------------------------- OB History  Gravidity:    2         Term:   0        Prem:   0        SAB:   0  TOP:          1       Ectopic:  0        Living: 0 ---------------------------------------------------------------------- Gestational Age  LMP:           19w 6d        Date:  04/03/20                 EDD:   01/08/21  U/S Today:     20w 0d                                        EDD:   01/07/21  Best:          19w 6d     Det. By:  LMP  (04/03/20)          EDD:   01/08/21 ---------------------------------------------------------------------- Anatomy  Cranium:               Appears normal         LVOT:                   Appears normal  Cavum:                 Appears normal         Aortic Arch:            Appears normal  Ventricles:            Appears normal         Ductal Arch:            Appears normal  Choroid Plexus:        Appears normal         Diaphragm:              Appears normal  Cerebellum:            Appears normal         Stomach:                Appears normal, left  sided  Posterior Fossa:       Appears normal         Abdomen:                Appears normal  Nuchal Fold:           Not  applicable (>85    Abdominal Wall:         Appears nml (cord                         wks GA)                                        insert, abd wall)  Face:                  Appears normal         Cord Vessels:           Appears normal (3                         (orbits and profile)                           vessel cord)  Lips:                  Appears normal         Kidneys:                Appear normal  Palate:                Appears normal         Bladder:                Appears normal  Thoracic:              Appears normal         Spine:                  Appears normal  Heart:                 Appears normal         Upper Extremities:      Appears normal                         (4CH, axis, and                         situs)  RVOT:                  Appears normal         Lower Extremities:      Appears normal  Other:  Heels visualized. Hands not well visualized. Female fetus. Parents do          not wish to know sex of fetus. Technically difficult due to fetal position. ---------------------------------------------------------------------- Cervix Uterus Adnexa  Cervix  Length:            4.3  cm.  Closed . Normal appearance by transabdominal scan.  Uterus  No abnormality visualized.  Right Ovary  No adnexal mass visualized.  Left Ovary  No adnexal mass visualized.  Cul De Sac  No free fluid seen.  Adnexa  No abnormality visualized. ---------------------------------------------------------------------- Impression  We performed fetal anatomy scan. No makers of  aneuploidies or fetal structural defects are seen. Fetal  biometry is consistent with her previously-established dates.  Amniotic fluid is normal and good fetal activity is seen.  Patient understands the limitations of ultrasound in detecting  fetal anomalies.  Patient had opted not to screen for fetal aneuploidies.  MSAFP screening showed low risk for open-neural tube  defects .  "MyChart":Patient does not want to know fetal sex/gender.  We informed her that  fetal sex/gender will be mentioned in  ultrasound report and will be in "MyChart" that will be  accessible to the patient . ---------------------------------------------------------------------- Recommendations  Follow-up scans as clinically indicated. ----------------------------------------------------------------------                  Tama High, MD Electronically Signed Final Report   08/20/2020 09:47 am ----------------------------------------------------------------------   Future Appointments  Date Time Provider [place]  09/17/2020  9:35 AM Janet Berlin, MD [facility]    Discharge Condition: Stable  Discharge disposition: 01-Home or Self Care        Allergies as of 09/08/2020      Reactions   Peanut-containing Drug Products Anaphylaxis   All Nuts       Medication List    TAKE these medications   acetaminophen 325 MG tablet Commonly known as: TYLENOL Take 650 mg by mouth every 6 (six) hours as needed for moderate pain or fever.   Blood Pressure Kit Devi 1 Device by Does not apply route daily.   budesonide-formoterol 80-4.5 MCG/ACT inhaler Commonly known as: Symbicort Inhale 2 puffs into the lungs 2 (two) times daily.   cefadroxil 500 MG capsule Commonly known as: DURICEF Take 1 capsule (500 mg total) by mouth 2 (two) times daily for 10 days. Start taking on: September 09, 2020   EPINEPHrine 0.3 mg/0.3 mL Soaj injection Commonly known as: EPI-PEN Inject 0.3 mg into the muscle as needed for anaphylaxis.   multivitamin-prenatal 27-0.8 MG Tabs tablet Take 1 tablet by mouth daily at 12 noon.   nitrofurantoin (macrocrystal-monohydrate) 100 MG capsule Commonly known as: MACROBID Take 1 capsule (100 mg total) by mouth at bedtime. Start taking on: September 19, 2020   terconazole 0.4 % vaginal cream Commonly known as: TERAZOL 7 Place 1 applicator vaginally at bedtime.   valACYclovir 1000 MG tablet Commonly known as: Valtrex Take 1 tablet (1,000  mg total) by mouth 2 (two) times daily.       Follow-up [place] [facility] Follow up.   Specialty: Obstetrics and Gynecology Contact information: [place] [address] [phone]              Total discharge time: 20 minutes   Signed: Verita Schneiders M.D. 09/08/2020, 11:08 AM

## 2020-09-11 LAB — CULTURE, BLOOD (ROUTINE X 2)
Culture: NO GROWTH
Culture: NO GROWTH
Special Requests: ADEQUATE
Special Requests: ADEQUATE

## 2020-09-17 ENCOUNTER — Encounter (HOSPITAL_COMMUNITY): Payer: Self-pay | Admitting: Obstetrics and Gynecology

## 2020-09-17 ENCOUNTER — Other Ambulatory Visit (HOSPITAL_COMMUNITY): Payer: Self-pay | Admitting: Obstetrics & Gynecology

## 2020-09-17 ENCOUNTER — Ambulatory Visit (INDEPENDENT_AMBULATORY_CARE_PROVIDER_SITE_OTHER): Payer: 59 | Admitting: Obstetrics & Gynecology

## 2020-09-17 ENCOUNTER — Inpatient Hospital Stay (HOSPITAL_COMMUNITY)
Admission: AD | Admit: 2020-09-17 | Discharge: 2020-09-18 | Disposition: A | Discharge status: Home / Self Care | Payer: 59 | Attending: Obstetrics and Gynecology | Admitting: Obstetrics and Gynecology

## 2020-09-17 ENCOUNTER — Encounter: Payer: 59 | Admitting: Obstetrics and Gynecology

## 2020-09-17 ENCOUNTER — Other Ambulatory Visit: Payer: Self-pay

## 2020-09-17 VITALS — BP 128/75 | HR 85 | Wt 145.0 lb

## 2020-09-17 DIAGNOSIS — O99352 Diseases of the nervous system complicating pregnancy, second trimester: Secondary | ICD-10-CM | POA: Diagnosis not present

## 2020-09-17 DIAGNOSIS — O2302 Infections of kidney in pregnancy, second trimester: Secondary | ICD-10-CM

## 2020-09-17 DIAGNOSIS — G44209 Tension-type headache, unspecified, not intractable: Secondary | ICD-10-CM | POA: Insufficient documentation

## 2020-09-17 DIAGNOSIS — Z3492 Encounter for supervision of normal pregnancy, unspecified, second trimester: Secondary | ICD-10-CM

## 2020-09-17 DIAGNOSIS — Z3A26 26 weeks gestation of pregnancy: Secondary | ICD-10-CM | POA: Insufficient documentation

## 2020-09-17 DIAGNOSIS — O99891 Other specified diseases and conditions complicating pregnancy: Secondary | ICD-10-CM | POA: Diagnosis not present

## 2020-09-17 DIAGNOSIS — Z3A24 24 weeks gestation of pregnancy: Secondary | ICD-10-CM

## 2020-09-17 LAB — URINALYSIS, ROUTINE W REFLEX MICROSCOPIC
Bilirubin Urine: NEGATIVE
Glucose, UA: NEGATIVE mg/dL
Hgb urine dipstick: NEGATIVE
Ketones, ur: NEGATIVE mg/dL
Leukocytes,Ua: NEGATIVE
Nitrite: NEGATIVE
Protein, ur: NEGATIVE mg/dL
Specific Gravity, Urine: 1.01 (ref 1.005–1.030)
pH: 7 (ref 5.0–8.0)

## 2020-09-17 MED ORDER — ACETAMINOPHEN 500 MG PO TABS
1000.0000 mg | ORAL_TABLET | Freq: Four times a day (QID) | ORAL | Status: DC | PRN
  Administered 2020-09-17: 1000 mg via ORAL
  Filled 2020-09-17: qty 2

## 2020-09-17 MED ORDER — ALBUTEROL SULFATE HFA 108 (90 BASE) MCG/ACT IN AERS: 2.0000 | INHALATION_SPRAY | Freq: Four times a day (QID) | RESPIRATORY_TRACT | 1 refills | Status: DC | PRN

## 2020-09-17 MED ORDER — BUDESONIDE-FORMOTEROL FUMARATE 80-4.5 MCG/ACT IN AERO
2.0000 | INHALATION_SPRAY | Freq: Two times a day (BID) | RESPIRATORY_TRACT | 12 refills | Status: DC
Start: 2020-09-17 — End: 2022-06-07
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
## 2020-09-17 NOTE — Progress Notes (Signed)
  Patient ID: Sheri Walker, female   DOB: 11/15/1995, 24 y.o.   MRN: [id number]    PRENATAL VISIT NOTE  Subjective:  Sheri Walker is a 24 y.o. G2P0010 at [redacted]w[redacted]d being seen today for ongoing prenatal care.  She is currently monitored for the following issues for this low-risk pregnancy and has ASTHMA, INTERMITTENT; DERMATITIS, ATOPIC; ACNE VULGARIS, FACIAL; PERSONAL HISTORY OF ALLERGY TO PEANUTS; Supervision of low-risk pregnancy; HSV-2 (herpes simplex virus 2) infection; UTI (urinary tract infection) in pregnancy in first trimester; Pyelonephritis affecting pregnancy in second trimester; Hyponatremia; Hypokalemia; and [redacted] weeks gestation of pregnancy on their problem list.  Patient reports no complaints and denies urinary symptoms.  Contractions: Not present. Vag. Bleeding: None.  Movement: Present. Denies leaking of fluid.   The following portions of the patient's history were reviewed and updated as appropriate: allergies, current medications, past family history, past medical history, past social history, past surgical history and problem list.   Objective:   Vitals:   09/17/20 1545  BP: 128/75  Pulse: 85  Weight: 145 lb (65.8 kg)    Fetal Status: Fetal Heart Rate (bpm): 155   Movement: Present     General:  Alert, oriented and cooperative. Patient is in no acute distress.  Skin: Skin is warm and dry. No rash noted.   Cardiovascular: Normal heart rate noted  Respiratory: Normal respiratory effort, no problems with respiration noted  Abdomen: Soft, gravid, appropriate for gestational age.  Pain/Pressure: Present     Pelvic: Cervical exam deferred        Extremities: Normal range of motion.  Edema: None  Mental Status: Normal mood and affect. Normal behavior. Normal judgment and thought content.   Assessment and Plan:  Pregnancy: G2P0010 at [redacted]w[redacted]d 1. Pyelonephritis affecting pregnancy in second trimester Continue abx, then suppression abx   2. Encounter for supervision  of low-risk pregnancy in second trimester Glucola next visit.   Preterm labor symptoms and general obstetric precautions including but not limited to vaginal bleeding, contractions, leaking of fluid and fetal movement were reviewed in detail with the patient. Please refer to After Visit Summary for other counseling recommendations.   Return in 4 weeks (on 10/15/2020).  Future Appointments  Date Time Provider Department Center  10/15/2020  8:15 AM Currie Paris, NP [facility]  10/15/2020  8:50 AM [facility]    Malachy Chamber, MD

## 2020-09-17 NOTE — Progress Notes (Signed)
Attempted to call the patient x 3 and on last message stated to patient that we will have to request that she reschedules her appt.    Addison Naegeli, RN
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
## 2020-09-17 NOTE — Patient Instructions (Signed)
Gestational Diabetes Mellitus, Diagnosis Gestational diabetes (gestational diabetes mellitus) is a temporary form of diabetes that some women develop during pregnancy. It usually occurs around weeks 24-28 of pregnancy, and it goes away after delivery. Hormonal changes during pregnancy can interfere with insulin production and function, which may result in one or both of these problems:  The pancreas does not make enough of a hormone called insulin.  Cells in the body do not respond properly to insulin that the body makes (insulin resistance). Normally, insulin allows blood sugar (glucose) to enter cells in the body. The cells use glucose for energy. Insulin resistance or lack of insulin causes excess glucose to build up in the blood instead of going into cells. As a result, high blood glucose (hyperglycemia) develops. If gestational diabetes is treated, it is not likely to cause problems. If it is not controlled with treatment, it may cause problems during labor and delivery, and some of those problems can be harmful to the unborn baby (fetus) and the mother. Women who get gestational diabetes are more likely to develop it if they get pregnant again, and they are more likely to develop type 2 diabetes in the future. What increases the risk? This condition may be more likely to develop in pregnant women who:  Are older than age 25 during pregnancy.  Have a family history of diabetes.  Are overweight.  Had gestational diabetes in the past.  Have polycystic ovary syndrome (PCOS).  Are pregnant with twins or multiples.  Are of American-Indian, African-American, Hispanic/Latino, or Asian/Pacific Islander descent. What are the signs or symptoms? Most women do not notice symptoms of gestational diabetes because the symptoms are similar to normal symptoms of pregnancy. Symptoms of gestational diabetes may include:  Increased thirst (polydipsia).  Increased hunger(polyphagia).  Increased  urination (polyuria). How is this diagnosed? This condition may be diagnosed based on your blood glucose level, which may be checked with one or more of the following blood tests:  A fasting blood glucose (FBG) test. You will not be allowed to eat (you will fast) for 8 hours or longer before a blood sample is taken.  A random blood glucose test. This checks your blood glucose at any time of day regardless of when you ate.  An oral glucose tolerance test (OGTT). This is usually done during weeks 24-28 of pregnancy. ? For this test, you will have an FBG test done. Then, you will drink a beverage that contains glucose. Your blood glucose will be tested again one hour after you drink the glucose beverage (1-hour OGTT). ? If the 1-hour OGTT result is at or above 140 mg/dL (7.8 mmol/L), you will repeat the OGTT. This time, your blood glucose will be tested 3 hours after you drink the glucose beverage (3-hour OGTT). If you have risk factors, you may be screened for undiagnosed type 2 diabetes at your first health care visit during your pregnancy (prenatal visit). How is this treated?     Your treatment may be managed by a specialist called an endocrinologist. This condition is treated by following instructions from your health care provider about:  Eating a healthy diet and getting more physical activity. These changes are the most important ways to manage gestational diabetes.  Checking your blood glucose. Do this as often as told.  Taking diabetes medicines or insulin every day. These will only be prescribed if they are needed. ? If you use insulin, you may need to adjust your dosage based on how physically active   you are and what foods you eat. Your health care provider will tell you how to do this. Your health care provider will set treatment goals for you based on the stage of your pregnancy and any other medical conditions you have. Generally, the goal of treatment is to maintain the following  blood glucose levels during pregnancy:  Before meals (preprandial): at or below 95 mg/dL (5.3 mmol/L).  After meals (postprandial): ? One hour after a meal: at or below 140 mg/dL (7.8 mmol/L). ? Two hours after a meal: at or below 120 mg/dL (6.7 mmol/L).  A1c (hemoglobin A1c) level: 6-6.5%. Follow these instructions at home: Questions to ask your health care provider  Consider asking the following questions: ? Do I need to meet with a diabetes educator? ? What equipment will I need to manage my diabetes at home? ? What diabetes medicines do I need, and when should I take them? ? How often do I need to check my blood glucose? ? What number can I call if I have questions? ? When is my next appointment? General instructions  Take over-the-counter and prescription medicines only as told by your health care provider.  Manage your weight gain during pregnancy. The amount of weight that you are expected to gain depends on your pre-pregnancy BMI (body mass index).  Keep all follow-up visits as told by your health care provider. This is important.  For more information about diabetes, visit: ? American Diabetes Association (ADA): www.diabetes.org ? American Association of Diabetes Educators (AADE): www.diabeteseducator.org Contact a health care provider if:  Your blood glucose level is at or above 240 mg/dL (13.3 mmol/L).  Your blood glucose level is at or above 200 mg/dL (11.1 mmol/L) and you have ketones in your urine.  You have been sick or have had a fever for 2 days or longer and you are not getting better.  You have any of the following problems for more than 6 hours: ? You cannot eat or drink. ? You have nausea and vomiting. ? You have diarrhea. Get help right away if:  Your blood glucose is lower than 54 mg/dL (3 mmol/L).  You become confused or you have trouble thinking clearly.  You have difficulty breathing.  You have moderate or large ketone levels in your  urine.  Your baby is moving around less than usual.  You develop unusual discharge or bleeding from your vagina.  You start having contractions early (prematurely). Contractions may feel like a tightening in your lower abdomen. Summary  Gestational diabetes (gestational diabetes mellitus) is a temporary form of diabetes that some women develop during pregnancy. It usually occurs around weeks 24-28 of pregnancy, and it goes away after delivery.  This condition is treated by making diet and lifestyle changes and taking diabetes medicines or insulin, if needed.  Women who get gestational diabetes are more likely to develop it if they get pregnant again, and they are more likely to develop type 2 diabetes in the future. This information is not intended to replace advice given to you by your health care provider. Make sure you discuss any questions you have with your health care provider. Document Revised: 11/05/2017 Document Reviewed: 11/02/2015 Elsevier Patient Education  2020 Elsevier Inc.  

## 2020-09-18 DIAGNOSIS — Z3A24 24 weeks gestation of pregnancy: Secondary | ICD-10-CM | POA: Diagnosis not present

## 2020-09-18 DIAGNOSIS — O99352 Diseases of the nervous system complicating pregnancy, second trimester: Secondary | ICD-10-CM | POA: Diagnosis not present

## 2020-09-18 DIAGNOSIS — O99891 Other specified diseases and conditions complicating pregnancy: Secondary | ICD-10-CM | POA: Diagnosis not present

## 2020-09-18 DIAGNOSIS — Z3A26 26 weeks gestation of pregnancy: Secondary | ICD-10-CM | POA: Diagnosis not present

## 2020-09-18 DIAGNOSIS — G44209 Tension-type headache, unspecified, not intractable: Secondary | ICD-10-CM | POA: Diagnosis not present

## 2020-09-18 NOTE — Discharge Instructions (Signed)
Preventing Injuries During Pregnancy  Injuries can happen during pregnancy. Minor falls and accidents usually do not harm you or your baby. But some injuries can harm you and your baby. Tell your doctor about any injury you suffer. What can I do to avoid injuries? Safety  Remove rugs and loose objects on the floor.  Wear comfortable shoes that have a good grip. Do not wear shoes that have high heels.  Always wear your seat belt in the car. The lap belt should be below your belly. Always drive safely.  Do not ride on a motorcycle. Activity  Do not take part in rough and violent activities or sports.  Avoid: ? Walking on wet or slippery floors. ? Lifting heavy pots of boiling or hot liquids. ? Fixing electrical problems. ? Being near fires. General instructions  Take over-the-counter and prescription medicines only as told by your doctor.  Know your blood type and the blood type of the baby's father.  If you are a victim of domestic violence: ? Call your local emergency services (911 in the [place] ? Contact the National Domestic Violence Hotline for help and support. Get help right away if:  You fall on your belly or receive any serious blow to your belly.  You have a stiff neck or neck pain after a fall or an injury.  You get a headache or have problems with vision after an injury.  You do not feel the baby move or the baby is not moving as much as normal.  You have been a victim of domestic violence or any other kind of attack.  You have been in a car accident.  You have bleeding from your vagina.  Fluid is leaking from your vagina.  You start to have cramping or pain in your belly (contractions).  You have very bad pain in your lower back.  You feel weak or pass out (faint).  You start to throw up (vomit) after an injury.  You have been burned. Summary  Some injuries that happen during pregnancy can do harm to the baby.  Tell your doctor about any  injury.  Take steps to avoid injury. This includes removing rugs and loose objects on the floor. Always wear your seat belt in the car.  Do not take part in rough and violent activities or sports.  Get help right away if you have any serious accident or injury. This information is not intended to replace advice given to you by your health care provider. Make sure you discuss any questions you have with your health care provider. Document Revised: 06/24/2019 Document Reviewed: 10/08/2016 Elsevier Patient Education  2020 Elsevier Inc.  

## 2020-10-01 NOTE — Progress Notes (Signed)
erroneus encounter

## 2020-10-11 ENCOUNTER — Other Ambulatory Visit: Payer: Self-pay

## 2020-10-11 DIAGNOSIS — Z3490 Encounter for supervision of normal pregnancy, unspecified, unspecified trimester: Secondary | ICD-10-CM

## 2020-10-13 NOTE — L&D Delivery Note (Signed)
OB/GYN Faculty Practice Delivery Note  Sheri Walker is a 25 y.o. G2P0010 s/p svd at [redacted]w[redacted]d. She was admitted for PROM.   ROM: 41h 23m with clear fluid GBS Status:  Positive/-- (03/07 1654) Maximum Maternal Temperature: 37.2  Labor Progress: . Initial SVE: 3 cm. She was started on pitocin on 4/2 at 1530. She then progressed to complete.   Delivery Date/Time: 4/4 at 01:42 Delivery: Called to room and patient was complete and pushing. Head delivered ROP. Loose nuchal cord present. After head delivered, unable to deliver body with tractions. A shoulder dystocia was called. Patient placed in McRobert's and dystocia relieved with delivery of posterior arm. Approximately 30 second dystocia. Infant with spontaneous cry, placed on mother's abdomen, dried and stimulated. Cord clamped x 2 after 1-minute delay, and cut by FOB. Cord blood drawn. Placenta delivered spontaneously with gentle cord traction. Fundus firm with massage and Pitocin. Labia, perineum, vagina, and cervix inspected inspected with right vaginal/labial tear, repaired in routine fashion.  Baby Weight: pending  Placenta: Sent to L&D Complications: 30s shoulder dystocia  Lacerations: right vaginal/labial, repaired  EBL: 100 mL Analgesia: local lidocaine for repair    Infant:  APGAR (1 MIN):   APGAR (5 MINS):   APGAR (10 MINS):     Casper Harrison, MD [facility] Family Medicine Fellow, [facility] for [facility], [facility] 01/14/2021, 2:05 AM

## 2020-10-15 ENCOUNTER — Other Ambulatory Visit: Payer: 59

## 2020-10-15 ENCOUNTER — Encounter: Payer: 59 | Admitting: Nurse Practitioner

## 2020-10-27 DIAGNOSIS — Z1152 Encounter for screening for COVID-19: Secondary | ICD-10-CM | POA: Diagnosis not present

## 2020-11-05 ENCOUNTER — Other Ambulatory Visit: Payer: Self-pay

## 2020-11-05 ENCOUNTER — Ambulatory Visit (INDEPENDENT_AMBULATORY_CARE_PROVIDER_SITE_OTHER): Payer: 59 | Admitting: Obstetrics and Gynecology

## 2020-11-05 ENCOUNTER — Other Ambulatory Visit: Payer: 59

## 2020-11-05 VITALS — BP 120/79 | HR 95 | Wt 161.2 lb

## 2020-11-05 DIAGNOSIS — Z3490 Encounter for supervision of normal pregnancy, unspecified, unspecified trimester: Secondary | ICD-10-CM

## 2020-11-05 DIAGNOSIS — Z23 Encounter for immunization: Secondary | ICD-10-CM | POA: Diagnosis not present

## 2020-11-05 DIAGNOSIS — Z3493 Encounter for supervision of normal pregnancy, unspecified, third trimester: Secondary | ICD-10-CM | POA: Diagnosis not present

## 2020-11-05 DIAGNOSIS — O2302 Infections of kidney in pregnancy, second trimester: Secondary | ICD-10-CM

## 2020-11-05 DIAGNOSIS — B009 Herpesviral infection, unspecified: Secondary | ICD-10-CM

## 2020-11-05 DIAGNOSIS — Z3A30 30 weeks gestation of pregnancy: Secondary | ICD-10-CM

## 2020-11-05 NOTE — Progress Notes (Signed)
Has not downloaded [employer]. Verified she did receive email. Instructions given to download app today and start logging in blood pressures weekly.  She had bp cuff and states knows how to use it. Trequan Marsolek,RN

## 2020-11-05 NOTE — Progress Notes (Signed)
   PRENATAL VISIT NOTE  Subjective:  Sheri Walker is a 25 y.o. G2P0010 at [redacted]w[redacted]d being seen today for ongoing prenatal care.  She is currently monitored for the following issues for this low-risk pregnancy and has ASTHMA, INTERMITTENT; DERMATITIS, ATOPIC; ACNE VULGARIS, FACIAL; PERSONAL HISTORY OF ALLERGY TO PEANUTS; Supervision of low-risk pregnancy; HSV-2 (herpes simplex virus 2) infection; UTI (urinary tract infection) in pregnancy in first trimester; Pyelonephritis affecting pregnancy in second trimester; Hyponatremia; Hypokalemia; and [redacted] weeks gestation of pregnancy on their problem list.  Patient reports no complaints.  Contractions: Not present. Vag. Bleeding: None.  Movement: Present. Denies leaking of fluid.     The following portions of the patient's history were reviewed and updated as appropriate: allergies, current medications, past family history, past medical history, past social history, past surgical history and problem list.   Objective:   Vitals:   11/05/20 0837  BP: 120/79  Pulse: 95  Weight: 161 lb 3.2 oz (73.1 kg)    Fetal Status: Fetal Heart Rate (bpm): 160   Movement: Present     General:  Alert, oriented and cooperative. Patient is in no acute distress.  Skin: Skin is warm and dry. No rash noted.   Cardiovascular: Normal heart rate noted  Respiratory: Normal respiratory effort, no problems with respiration noted  Abdomen: Soft, gravid, appropriate for gestational age.  Pain/Pressure: Present     Pelvic: Cervical exam deferred        Extremities: Normal range of motion.  Edema: None  Mental Status: Normal mood and affect. Normal behavior. Normal judgment and thought content.   Assessment and Plan:  Pregnancy: G2P0010 at [redacted]w[redacted]d 1. Encounter for supervision of low-risk pregnancy, antepartum, [redacted] weeks gestation - Glucola, labs today  - Tdap vaccine greater than or equal to 7yo IM  2. Pyelonephritis affecting pregnancy in second trimester -continue  suppressive therapy   3. HSV-2 (herpes simplex virus 2) infection - suppression at 36 weeks   Preterm labor symptoms and general obstetric precautions including but not limited to vaginal bleeding, contractions, leaking of fluid and fetal movement were reviewed in detail with the patient. Please refer to After Visit Summary for other counseling recommendations.   Return in about 2 weeks (around 11/19/2020) for OB, any provider.  Future Appointments  Date Time Provider Department Center  11/05/2020  8:55 AM [place], Arlana Pouch, MD [facility]    Gita Kudo, MD

## 2020-11-05 NOTE — Patient Instructions (Signed)
AREA PEDIATRIC/FAMILY PRACTICE PHYSICIANS  [place] ([address]) . [facility] o Chambliss, MD; Eniola, MD; Hale, MD; Hensel, MD; McDiarmid, MD; McIntyer, MD; Neal, MD; Walden, MD o [address] [place], [place] [address] o [phone] o Mon-Fri 8:30-12:30, 1:30-5:00 o Providers come to see babies at [facility] o Accepting Medicaid . [facility] o Limited providers who accept newborns: Koirala, MD; Morrow, MD; Wolters, MD o [address], [place], [place] [address] o [phone] o Mon-Fri 8:00-5:30 o Babies seen by providers at [facility] o Does NOT accept Medicaid o Please call early in hospitalization for appointment (limited availability)  . [facility] o Mulberry, MD o [address] [place], [place] [address] o [phone] o Mon, Tue, Thur, Fri 8:30-5:00, Wed 10:00-7:00 (closed 1-2pm) o Babies seen by [facility] providers o Accepting Medicaid . [facility] o Rubin, MD o [address], [place], [place] [address] o [phone] o Mon-Fri 8:30-5:00, Sat 8:30-12:00 o Provider comes to see babies at [facility] o Accepting Medicaid o Must have been referred from current patients or contacted office prior to delivery . [facility] ([facility]) o Brown, MD; Chandler, MD; Ettefagh, MD; Grant, MD; Lester, MD; McCormick, MD; McQueen, MD; Prose, MD; Simha, MD; Stanley, MD; Stryffeler, NP; Tebben, NP o [address], [place], [place] [address] o [phone] o Mon, Tue, Thur, Fri 8:30-5:30, Wed 9:30-5:30, Sat 8:30-12:30 o Babies seen by [facility] providers o Accepting Medicaid o Only accepting infants of first-time parents or siblings of current patients o Hospital discharge coordinator will make follow-up appointment . Jack Amos o [address],  [place], [place]  [address] o [phone]   Fax - [phone] . [facility] o [address], [place], [place]  [address] o Phone - [phone]   Fax - [phone] . Shilpa Gosrani o [address], [place], [place]  [address] o [phone]  [place] ([address]) .  [facility] o Bates, MD; Brassfield, MD; Cooper, Cox, MD; MD; Davis, MD; Dovico, MD; Ettefaugh, MD; Little, MD; Lowe, MD; Keiffer, MD; Melvin, MD; Sumner, MD; Williams, MD o [address], [place], [place] [address] o [phone] o Mon-Fri 8:30-5:00 (extended evenings Mon-Thur as needed), Sat-Sun 10:00-1:00 o Providers come to see babies at [facility] o Accepting Medicaid for families of first-time babies and families with all children in the household age 3 and under. Must register with office prior to making appointment (M-F only). . [facility] o Henson, NP; Knapp, MD; Lalonde, MD; Tysinger, PA o [address] [place], [place] [address] o [phone] o Mon-Fri 8:00-5:00 o Babies seen by providers at [facility] o Does NOT accept Medicaid/Commercial Insurance Only . [facility] ([facility]  o Artis, MD; Barnes, MD; Bratton, MD; Coccaro, MD; Lockett Gardner, MD; Kramer, MD; Marshall, MD; Netherton, MD; Poleto, MD; Skinner, MD o [address] [place], [place] [address] o [phone] o Mon-Fri 8:30-5:30, Sat (Oct.-Mar.) 9:00-1:00 o Babies seen by providers at [facility] o Accepting Medicaid  [place] ([address]) . [facility] o Reid, MD; Warner, MD o [address], [place],  [address] o [phone] o Mon-Fri 8:30-5:00, Sat 8:30-12:00 o Providers come to see babies at [facility] o Does NOT accept Medicaid . [facility] o Becker, PA; Hagler, MD; Scifres, PA; Sun, MD; Swayne, MD o [address],  [place], [place] [address] o [phone] o Mon-Fri 8:00-5:00 o Babies seen by providers at [facility] o Does NOT accept Medicaid o Only accepting babies of parents who are patients o Please call early in hospitalization for appointment (limited availability) . [facility] o Clark, MD; Frye, MD; Kelleher, MD; Mack, NP; Miller, MD; O'Keller, MD; Patterson, NP; Pudlo, MD; Puzio, MD; Thomas, MD; Tucker, MD; Twiselton, MD o [address], [place], [place] [address] o [phone] o Mon-Fri 8:00-5:00, Sat 9:00-12:00 o Providers come to see babies at [facility] o Does NOT accept Medicaid  [place] ([address]) . [facility] o Limited providers accepting new patients: Brake, NP; Wharton, PA o [address], [place], [place] [address] o [phone] o Mon-Fri 8:00-5:00 o Babies seen by providers at [facility] o Does NOT accept Medicaid o Only accepting babies of parents who are patients o Please call early in hospitalization for appointment (limited availability) . [facility] o Gay, MD; Quinlan, MD o [address] [place], [place] [address] o [phone] (press 1 to schedule appointment) o Mon-Fri 8:00-5:00 o Providers come to see babies at [facility] o Does NOT accept Medicaid . [facility] o Mazer, MD o [address] [place], [place] [address] o [phone] o Mon-Fri 8:30-5:00 (lunch 12:30-1:00), extended hours by appointment only Wed 5:00-6:30 o Babies seen by [facility] providers o Accepting Medicaid . [facility] o Banks, MD; Jordan, MD; Koberlein, MD o [address], [place], [place] [address] o [phone] o Mon-Fri 8:00-5:00 o Babies seen by [facility] providers o Does NOT accept Medicaid . [facility] o Parker, MD; Hunter, MD; Wallace, DO o [address] [place], [place]  [address] o [phone] o Mon-Fri 8:00-5:00 o Babies seen by [facility] providers o Does NOT accept Medicaid . [facility] o Brandon, PA; Brecken, PA; Christy, NP; Dees, MD; DeClaire, MD; DeWeese, MD; Hansen, NP; Mills, NP; Parrish, NP; Smoot, NP; Summer, MD; Vapne, MD o [address] [place], [place] [address] o [phone] o Mon-Fri 8:30-5:00, Sat 10:00-1:00 o Providers come to see babies at [facility] o Does NOT accept Medicaid o Free prenatal information session Tuesdays at 4:45pm . [facility] o Bouska, MD; Gordon, PA; Jeffery, PA; Weber, PA o [address] [place] [address] o [phone] o Mon-Fri 7:30-5:30 o Babies seen by [facility] providers . [facility] o [address], [place], [place]  [address] o [phone]   Fax - [phone]  [place] ([address]) . [facility] o [place]ese, [place] o [address] [place], [place] [address] o [phone] o Mon-Thur 8:00-6:00 o Providers come to see babies at [facility] o Accepting Medicaid . [facility] o Anderson, NP; Badger, MD; Beal, PA; Spencer, PA o [address] [place], [place] [address] o [phone] o Mon-Thur 7:30-7:30, Fri 7:30-4:30 o Babies seen by [facility] providers o Accepting Medicaid . [facility] o Agbuya, MD; Klett, NP; Romgoolam, MD o [address], [place], [place] [address] o [phone] o Mon-Fri 8:30-5:00, Sat 8:30-12:00 o Providers come to see babies at [facility] o Accepting Medicaid o Must have "Meet & Greet" appointment at office prior to delivery . [facility] o McCord,   MD; Wallace, MD; Wood, MD o [address], [place], [place] [address] o [phone] o Mon-Wed 8:00-6:00, Thur-Fri 8:00-5:00, Sat 9:00-12:00 o Providers come to  see babies at [facility] o Does NOT accept Medicaid o Only accepting siblings of current patients . [facility]  o [address], [place], [place]  [address] o [phone]   Fax - [phone] . [facility] o [address], [place], [place]  [address] o [phone]   Fax - [phone]  [place] ([address]) . [facility] o Cirigliano, DO; Matthews, DO o [address] [place], [place] [address] o [phone] o Mon-Fri 7:00-5:00 o Babies seen by [facility] providers o Does NOT accept Medicaid . [facility] o Briscoe, MD; Howley, PA; Moreira, PA o [address], [place], [place] [address] o [phone] o Mon-Fri 8:00-5:00 o Babies seen by [facility] providers o Accepting Medicaid . [facility] o Boyd, MD; Church, PA; Jones, NP; Osborn, PA o [address], , [place] [address] o [phone] o Mon-Fri 8:00-5:00 o Babies seen by providers at [facility] o Accepting Medicaid  [place] ([address]) . [facility] [place] o Wendling, DO o [address] [place], [place] [address] o [phone] o Mon-Fri 8:00-5:00 o Babies seen by [facility] providers o Does NOT accept Medicaid o Limited availability, please call early in hospitalization to schedule follow-up . [facility] o Calderon, PA; Cummings, MD; Dillard, MD; Martin, PA; Olson, MD; VanDeven, PA o [address], [place], [place] [address] o [phone] o Mon-Fri 8:30-5:00, Sat 9:00-12:00 o Babies seen by providers at [facility] o Accepting Medicaid o Please register online then schedule online or call office o [email or web address] . [facility] o Hunter, NP; Kumar, MD; Martin Rogers, PA o [address], [place], [place] [address] o [phone] o Mon-Fri 8:00-5:00 o Babies seen by providers at [facility] o Accepting Medicaid . [facility] o Glen Campbell, MD; Kristi Fleenor, NP; West, MD o [address], [place], [place] [address] o [phone] o Mon-Fri 8:00-5:30, Sat&Sun by appointment (phones open at 8:30) o Babies seen by [facility] providers o Accepting Medicaid o Must be a first-time baby or sibling of current patient . [facility] [place]  o [address], [place], [place]  [address] o [phone]   Fax - [phone]  [place] ([address]) . [facility] o Brown, PA; Cowen, PA; Rice, MD; Helton, PA; Spry, MD o [address] [place], [place] [address] o [phone] o Mon-Thur 8:00-7:00, Fri 8:00-5:00, Sat 8:00-12:00, Sun 9:00-12:00 o Babies seen by [facility] providers o Accepting Medicaid . [facility] o Coe-Goins, MD; Marshall, MD; Pierre-Louis, MD o [address] [place] [address], [place], [place] [address] o [phone] o Mon-Thur 8:00-5:00 o Babies seen by providers at [facility] o Accepting Medicaid . [facility] o Bratton, MD; Coe-Goins, MD; Hayes, MD; Lewis, MD; List, MD; Lott, MD; Marshall, MD; Moran, MD; O'Neal, MD; Pierre-Louis, MD; Pitonzo, MD; Scholer, MD; Spangle, MD o [address] [place], [place]   [address] o [phone] o Mon-Fri 8:00-5:30, Sat (Oct.-Mar.) 9:00-1:00 o Babies seen by providers at [facility] o Accepting Medicaid o Must fill out new patient packet, available online at [email or web address] . [facility] ([facility] o Friddle, NP; Harris, NP; Kelly, NP; Logan, MD;  Melvin, PA; Poth, MD; Ramadoss, MD; Stanton, NP o [address], [place], [place] [address] o [phone] o Mon-Thur 8:00-5:30, Fri 8:00-5:00 o Babies seen by providers at [facility] o Accepting Medicaid  [place] ([address]) . [facility] o Dixon, PA; Lillian, MD; Pickard, MD; Tapia, PA o [address], [place], [place] [address] o [phone] o Mon-Fri 8:00-5:00 o Babies seen by providers at [facility] o Accepting Medicaid   [place] ([address]) . [facility] [place] o Masneri, DO; Meyers, MD; Nelson, PA o [address], [place], [place] [address] o [phone] o Mon-Fri 8:00-5:00 o Babies seen by providers at [facility] o Does NOT accept Medicaid o Limited appointment availability, please call early in hospitalization  . [facility] [place] o Kunedd, DO; McGowen, MD o [address], [place], [place] [address] o [phone] o Mon-Fri 8:00-5:00 o Babies seen by [facility] providers o Does NOT accept Medicaid . [facility] [place] o Cameron, MD; MacDonald, MD; Michaels, PA; Nayak, MD o [address] [place] [address], [place], [place] [address] o [phone] o Mon-Fri 8:00-5:00 o After hours clinic ([address] [place], [place] [address]) [phone] Mon-Fri 5:00-8:00, Sat 12:00-6:00, Sun 10:00-4:00 o Babies seen by [facility] providers o Accepting Medicaid . [facility] [place] o [address], [place], [place]  [address] o [phone]   Fax - [phone]  [place] ([address]) .  [facility] [place] [facility] o Andy, MD o [address], [place], [place] [address] o [phone] o Mon-Fri 8:00-5:00 o Babies seen by [facility] providers o Does NOT accept Medicaid . [facility] [place] ([facility] [place]) o Eksir, MD o [address], [place], [place]  [address] o [phone] o Mon-Thur 8:00-7:00, Fri 8:00-5:00, Sat 8:00-12:00 o Babies seen by providers at [facility] o Accepting Medicaid - but does not have vaccinations in office (must be received elsewhere) o Limited availability, please call early in hospitalization  [place] ([address]) . [facility]  o Charlene Flemming, MD o [address], [place]  [address] o [phone]  Fax [phone]   

## 2020-11-06 LAB — CBC
Hematocrit: 41.2 % (ref 34.0–46.6)
Hemoglobin: 13.8 g/dL (ref 11.1–15.9)
MCH: 32.2 pg (ref 26.6–33.0)
MCHC: 33.5 g/dL (ref 31.5–35.7)
MCV: 96 fL (ref 79–97)
Platelets: 218 10*3/uL (ref 150–450)
RBC: 4.28 x10E6/uL (ref 3.77–5.28)
RDW: 12.5 % (ref 11.7–15.4)
WBC: 11.1 10*3/uL — ABNORMAL HIGH (ref 3.4–10.8)

## 2020-11-06 LAB — HIV ANTIBODY (ROUTINE TESTING W REFLEX): HIV Screen 4th Generation wRfx: NONREACTIVE

## 2020-11-06 LAB — RPR: RPR Ser Ql: NONREACTIVE

## 2020-11-06 LAB — GLUCOSE TOLERANCE, 2 HOURS W/ 1HR
Glucose, 1 hour: 102 mg/dL (ref 65–179)
Glucose, 2 hour: 103 mg/dL (ref 65–152)
Glucose, Fasting: 79 mg/dL (ref 65–91)

## 2020-11-19 ENCOUNTER — Telehealth (INDEPENDENT_AMBULATORY_CARE_PROVIDER_SITE_OTHER): Payer: 59 | Admitting: Obstetrics and Gynecology

## 2020-11-19 DIAGNOSIS — O99513 Diseases of the respiratory system complicating pregnancy, third trimester: Secondary | ICD-10-CM

## 2020-11-19 DIAGNOSIS — Z3A32 32 weeks gestation of pregnancy: Secondary | ICD-10-CM | POA: Insufficient documentation

## 2020-11-19 DIAGNOSIS — J4520 Mild intermittent asthma, uncomplicated: Secondary | ICD-10-CM

## 2020-11-19 DIAGNOSIS — O2302 Infections of kidney in pregnancy, second trimester: Secondary | ICD-10-CM

## 2020-11-19 DIAGNOSIS — O98513 Other viral diseases complicating pregnancy, third trimester: Secondary | ICD-10-CM

## 2020-11-19 DIAGNOSIS — B009 Herpesviral infection, unspecified: Secondary | ICD-10-CM

## 2020-11-19 DIAGNOSIS — Z3493 Encounter for supervision of normal pregnancy, unspecified, third trimester: Secondary | ICD-10-CM

## 2020-11-19 DIAGNOSIS — N12 Tubulo-interstitial nephritis, not specified as acute or chronic: Secondary | ICD-10-CM

## 2020-11-19 NOTE — Patient Instructions (Signed)

## 2020-11-19 NOTE — Progress Notes (Signed)
   OBSTETRICS PRENATAL VIRTUAL VISIT ENCOUNTER NOTE  Provider location: Center for [facility]   I connected with Sheri Walker on 11/19/20 at  8:15 AM EST by MyChart Video Encounter at home and verified that I am speaking with the correct person using two identifiers.   I discussed the limitations, risks, security and privacy concerns of performing an evaluation and management service virtually and the availability of in person appointments. I also discussed with the patient that there may be a patient responsible charge related to this service. The patient expressed understanding and agreed to proceed. Subjective:  Sheri Walker is a 25 y.o. G2P0010 at [redacted]w[redacted]d being seen today for ongoing prenatal care.  She is currently monitored for the following issues for this low-risk pregnancy and has Asthma; DERMATITIS, ATOPIC; ACNE VULGARIS, FACIAL; PERSONAL HISTORY OF ALLERGY TO PEANUTS; Supervision of low-risk pregnancy; HSV-2 (herpes simplex virus 2) infection; UTI (urinary tract infection) in pregnancy in first trimester; Pyelonephritis affecting pregnancy in second trimester; Hyponatremia; Hypokalemia; [redacted] weeks gestation of pregnancy; and [redacted] weeks gestation of pregnancy on their problem list.  Patient reports no complaints.  Contractions: Not present. Vag. Bleeding: None.  Movement: Present. Denies any leaking of fluid.   The following portions of the patient's history were reviewed and updated as appropriate: allergies, current medications, past family history, past medical history, past social history, past surgical history and problem list.   Objective:  There were no vitals filed for this visit.  Fetal Status:     Movement: Present     General:  Alert, oriented and cooperative. Patient is in no acute distress.  Respiratory: Normal respiratory effort, no problems with respiration noted  Mental Status: Normal mood and affect. Normal behavior. Normal  judgment and thought content.  Rest of physical exam deferred due to type of encounter  Imaging: No results found.  Assessment and Plan:  Pregnancy: G2P0010 at [redacted]w[redacted]d 1. Pyelonephritis affecting pregnancy in second trimester No s/sx currently  2. Encounter for supervision of low-risk pregnancy in third trimester Labs are up to date, continue routine care  3. [redacted] weeks gestation of pregnancy   4. HSV-2 (herpes simplex virus 2) infection No current outbreak, valtrex at 36 weeks  5. Mild intermittent asthma without complication No exacerbations  Preterm labor symptoms and general obstetric precautions including but not limited to vaginal bleeding, contractions, leaking of fluid and fetal movement were reviewed in detail with the patient. I discussed the assessment and treatment plan with the patient. The patient was provided an opportunity to ask questions and all were answered. The patient agreed with the plan and demonstrated an understanding of the instructions. The patient was advised to call back or seek an in-person office evaluation/go to [facility] at [facility] for any urgent or concerning symptoms. Please refer to After Visit Summary for other counseling recommendations.   I provided 10 minutes of face-to-face time during this encounter.  Return in about 2 weeks (around 12/03/2020) for ROB, in person.   Warden Fillers, MD Center for [employer], [facility]

## 2020-11-19 NOTE — Progress Notes (Signed)
Pt states does not have BP C[place]ff right now, will take later & record.

## 2020-12-03 ENCOUNTER — Ambulatory Visit (INDEPENDENT_AMBULATORY_CARE_PROVIDER_SITE_OTHER): Payer: 59 | Admitting: Medical

## 2020-12-03 ENCOUNTER — Other Ambulatory Visit: Payer: Self-pay

## 2020-12-03 VITALS — BP 133/86 | HR 114 | Wt 172.6 lb

## 2020-12-03 DIAGNOSIS — B009 Herpesviral infection, unspecified: Secondary | ICD-10-CM

## 2020-12-03 DIAGNOSIS — Z3493 Encounter for supervision of normal pregnancy, unspecified, third trimester: Secondary | ICD-10-CM

## 2020-12-03 DIAGNOSIS — Z8639 Personal history of other endocrine, nutritional and metabolic disease: Secondary | ICD-10-CM | POA: Insufficient documentation

## 2020-12-03 DIAGNOSIS — Z3A34 34 weeks gestation of pregnancy: Secondary | ICD-10-CM

## 2020-12-03 DIAGNOSIS — O2302 Infections of kidney in pregnancy, second trimester: Secondary | ICD-10-CM

## 2020-12-03 DIAGNOSIS — O2603 Excessive weight gain in pregnancy, third trimester: Secondary | ICD-10-CM | POA: Insufficient documentation

## 2020-12-03 NOTE — Progress Notes (Signed)
   PRENATAL VISIT NOTE  Subjective:  Sheri Walker is a 25 y.o. G2P0010 at [redacted]w[redacted]d being seen today for ongoing prenatal care.  She is currently monitored for the following issues for this low-risk pregnancy and has Asthma; DERMATITIS, ATOPIC; ACNE VULGARIS, FACIAL; PERSONAL HISTORY OF ALLERGY TO PEANUTS; Supervision of low-risk pregnancy; HSV-2 (herpes simplex virus 2) infection; UTI (urinary tract infection) in pregnancy in first trimester; Pyelonephritis affecting pregnancy in second trimester; Hyponatremia; History of hypokalemia; and Excessive weight gain in pregnancy in third trimester on their problem list.  Patient reports no complaints.  Contractions: Irritability. Vag. Bleeding: None.  Movement: Present. Denies leaking of fluid.   The following portions of the patient's history were reviewed and updated as appropriate: allergies, current medications, past family history, past medical history, past social history, past surgical history and problem list.   Objective:   Vitals:   12/03/20 0925  BP: 133/86  Pulse: (!) 114  Weight: 172 lb 9.6 oz (78.3 kg)    Fetal Status: Fetal Heart Rate (bpm): 140 Fundal Height: 36 cm Movement: Present     General:  Alert, oriented and cooperative. Patient is in no acute distress.  Skin: Skin is warm and dry. No rash noted.   Cardiovascular: Normal heart rate noted  Respiratory: Normal respiratory effort, no problems with respiration noted  Abdomen: Soft, gravid, appropriate for gestational age.  Pain/Pressure: Absent     Pelvic: Cervical exam deferred        Extremities: Normal range of motion.  Edema: None  Mental Status: Normal mood and affect. Normal behavior. Normal judgment and thought content.   Assessment and Plan:  Pregnancy: G2P0010 at [redacted]w[redacted]d 1. Encounter for supervision of low-risk pregnancy in third trimester - Plans to use [facility] - Anticipatory guidance for GBS and GC/Chlamydia next visit discussed  2. HSV-2 (herpes  simplex virus 2) infection - Start Valtrex ppx at 36 weeks, discussed   3. [redacted] weeks gestation of pregnancy   4. History of hypokalemia - normal at last check   5. Pyelonephritis affecting pregnancy in second trimester - Advised to stop macrobid at 36 weeks   6. Excessive weight gain in pregnancy in third trimester - Discussed appropriate diet in pregnancy  Preterm labor symptoms and general obstetric precautions including but not limited to vaginal bleeding, contractions, leaking of fluid and fetal movement were reviewed in detail with the patient. Please refer to After Visit Summary for other counseling recommendations.   Return in about 2 weeks (around 12/17/2020) for LOB, In-Person, any provider.  No future appointments.  Vonzella Nipple, PA-C

## 2020-12-03 NOTE — Patient Instructions (Signed)
Fetal Movement Counts Patient Name: ________________________________________________ Patient Due Date: ____________________  What is a fetal movement count? A fetal movement count is the number of times that you feel your baby move during a certain amount of time. This may also be called a fetal kick count. A fetal movement count is recommended for every pregnant woman. You may be asked to start counting fetal movements as early as week 28 of your pregnancy. Pay attention to when your baby is most active. You may notice your baby's sleep and wake cycles. You may also notice things that make your baby move more. You should do a fetal movement count:  When your baby is normally most active.  At the same time each day. A good time to count movements is while you are resting, after having something to eat and drink. How do I count fetal movements? 1. Find a quiet, comfortable area. Sit, or lie down on your side. 2. Write down the date, the start time and stop time, and the number of movements that you felt between those two times. Take this information with you to your health care visits. 3. Write down your start time when you feel the first movement. 4. Count kicks, flutters, swishes, rolls, and jabs. You should feel at least 10 movements. 5. You may stop counting after you have felt 10 movements, or if you have been counting for 2 hours. Write down the stop time. 6. If you do not feel 10 movements in 2 hours, contact your health care provider for further instructions. Your health care provider may want to do additional tests to assess your baby's well-being. Contact a health care provider if:  You feel fewer than 10 movements in 2 hours.  Your baby is not moving like he or she usually does. Date: ____________ Start time: ____________ Stop time: ____________ Movements: ____________ Date: ____________ Start time: ____________ Stop time: ____________ Movements: ____________ Date: ____________  Start time: ____________ Stop time: ____________ Movements: ____________ Date: ____________ Start time: ____________ Stop time: ____________ Movements: ____________ Date: ____________ Start time: ____________ Stop time: ____________ Movements: ____________ Date: ____________ Start time: ____________ Stop time: ____________ Movements: ____________ Date: ____________ Start time: ____________ Stop time: ____________ Movements: ____________ Date: ____________ Start time: ____________ Stop time: ____________ Movements: ____________ Date: ____________ Start time: ____________ Stop time: ____________ Movements: ____________ This information is not intended to replace advice given to you by your health care provider. Make sure you discuss any questions you have with your health care provider. Document Revised: 05/19/2019 Document Reviewed: 05/19/2019 Elsevier Patient Education  2021 Elsevier Inc. Rosen's Emergency Medicine: Concepts and Clinical Practice (9th ed., pp. 2296- 2312). Elsevier.">    Braxton Hicks Contractions Contractions of the uterus can occur throughout pregnancy, but they are not always a sign that you are in labor. You may have practice contractions called Braxton Hicks contractions. These false labor contractions are sometimes confused with true labor. What are Braxton Hicks contractions? Braxton Hicks contractions are tightening movements that occur in the muscles of the uterus before labor. Unlike true labor contractions, these contractions do not result in opening (dilation) and thinning of the cervix. Toward the end of pregnancy (32-34 weeks), Braxton Hicks contractions can happen more often and may become stronger. These contractions are sometimes difficult to tell apart from true labor because they can be very uncomfortable. You should not feel embarrassed if you go to the hospital with false labor. Sometimes, the only way to tell if you are in true labor is   for your health care  provider to look for changes in the cervix. The health care provider will do a physical exam and may monitor your contractions. If you are not in true labor, the exam should show that your cervix is not dilating and your water has not broken. If there are no other health problems associated with your pregnancy, it is completely safe for you to be sent home with false labor. You may continue to have Braxton Hicks contractions until you go into true labor. How to tell the difference between true labor and false labor True labor  Contractions last 30-70 seconds.  Contractions become very regular.  Discomfort is usually felt in the top of the uterus, and it spreads to the lower abdomen and low back.  Contractions do not go away with walking.  Contractions usually become more intense and increase in frequency.  The cervix dilates and gets thinner. False labor  Contractions are usually shorter and not as strong as true labor contractions.  Contractions are usually irregular.  Contractions are often felt in the front of the lower abdomen and in the groin.  Contractions may go away when you walk around or change positions while lying down.  Contractions get weaker and are shorter-lasting as time goes on.  The cervix usually does not dilate or become thin. Follow these instructions at home:  Take over-the-counter and prescription medicines only as told by your health care provider.  Keep up with your usual exercises and follow other instructions from your health care provider.  Eat and drink lightly if you think you are going into labor.  If Braxton Hicks contractions are making you uncomfortable: ? Change your position from lying down or resting to walking, or change from walking to resting. ? Sit and rest in a tub of warm water. ? Drink enough fluid to keep your urine pale yellow. Dehydration may cause these contractions. ? Do slow and deep breathing several times an hour.  Keep  all follow-up prenatal visits as told by your health care provider. This is important.   Contact a health care provider if:  You have a fever.  You have continuous pain in your abdomen. Get help right away if:  Your contractions become stronger, more regular, and closer together.  You have fluid leaking or gushing from your vagina.  You pass blood-tinged mucus (bloody show).  You have bleeding from your vagina.  You have low back pain that you never had before.  You feel your baby's head pushing down and causing pelvic pressure.  Your baby is not moving inside you as much as it used to. Summary  Contractions that occur before labor are called Braxton Hicks contractions, false labor, or practice contractions.  Braxton Hicks contractions are usually shorter, weaker, farther apart, and less regular than true labor contractions. True labor contractions usually become progressively stronger and regular, and they become more frequent.  Manage discomfort from Braxton Hicks contractions by changing position, resting in a warm bath, drinking plenty of water, or practicing deep breathing. This information is not intended to replace advice given to you by your health care provider. Make sure you discuss any questions you have with your health care provider. Document Revised: 09/11/2017 Document Reviewed: 02/12/2017 Elsevier Patient Education  2021 Elsevier Inc.  

## 2020-12-17 ENCOUNTER — Other Ambulatory Visit: Payer: Self-pay

## 2020-12-17 ENCOUNTER — Other Ambulatory Visit (HOSPITAL_COMMUNITY)
Admission: RE | Admit: 2020-12-17 | Discharge: 2020-12-17 | Disposition: A | Discharge status: Home / Self Care | Payer: 59 | Source: Ambulatory Visit | Attending: Nurse Practitioner | Admitting: Nurse Practitioner

## 2020-12-17 ENCOUNTER — Ambulatory Visit (INDEPENDENT_AMBULATORY_CARE_PROVIDER_SITE_OTHER): Payer: 59 | Admitting: Nurse Practitioner

## 2020-12-17 ENCOUNTER — Encounter: Payer: Self-pay | Admitting: Nurse Practitioner

## 2020-12-17 VITALS — BP 126/76 | HR 102 | Wt 173.2 lb

## 2020-12-17 DIAGNOSIS — Z3A36 36 weeks gestation of pregnancy: Secondary | ICD-10-CM

## 2020-12-17 DIAGNOSIS — Z3493 Encounter for supervision of normal pregnancy, unspecified, third trimester: Secondary | ICD-10-CM

## 2020-12-17 MED ORDER — VALACYCLOVIR HCL 500 MG PO TABS: 500.0000 mg | ORAL_TABLET | Freq: Two times a day (BID) | ORAL | 1 refills | Status: DC

## 2020-12-17 NOTE — Progress Notes (Signed)
Patient stated white vaginal discharge started about a month ago and complains of feet & ankle pain

## 2020-12-17 NOTE — Progress Notes (Signed)
    Subjective:  Sheri Walker is a 25 y.o. G2P0010 at [redacted]w[redacted]d being seen today for ongoing prenatal care.  She is currently monitored for the following issues for this low-risk pregnancy and has Asthma; DERMATITIS, ATOPIC; ACNE VULGARIS, FACIAL; PERSONAL HISTORY OF ALLERGY TO PEANUTS; Supervision of low-risk pregnancy; HSV-2 (herpes simplex virus 2) infection; UTI (urinary tract infection) in pregnancy in first trimester; Pyelonephritis affecting pregnancy in second trimester; Hyponatremia; History of hypokalemia; and Excessive weight gain in pregnancy in third trimester on their problem list.  Patient reports occasional contractions.  Contractions: Not present. Vag. Bleeding: None.  Movement: Present. Denies leaking of fluid.   The following portions of the patient's history were reviewed and updated as appropriate: allergies, current medications, past family history, past medical history, past social history, past surgical history and problem list. Problem list updated.  Objective:   Vitals:   12/17/20 1640  BP: 126/76  Pulse: (!) 102  Weight: 173 lb 3.2 oz (78.6 kg)    Fetal Status: Fetal Heart Rate (bpm): 149 Fundal Height: 36 cm Movement: Present     General:  Alert, oriented and cooperative. Patient is in no acute distress.  Skin: Skin is warm and dry. No rash noted.   Cardiovascular: Normal heart rate noted  Respiratory: Normal respiratory effort, no problems with respiration noted  Abdomen: Soft, gravid, appropriate for gestational age. Pain/Pressure: Present     Pelvic:  Cervical exam deferred      vaginal swabs done  Extremities: Normal range of motion.  Edema: Trace  Mental Status: Normal mood and affect. Normal behavior. Normal judgment and thought content.   Urinalysis:      Assessment and Plan:  Pregnancy: G2P0010 at [redacted]w[redacted]d  1. Encounter for supervision of low-risk pregnancy in third trimester Doing well.  Baby moving well.  - GC/Chlamydia probe amp ([facility])not at [facility] - Culture, beta strep (group b only)   Term labor symptoms and general obstetric precautions including but not limited to vaginal bleeding, contractions, leaking of fluid and fetal movement were reviewed in detail with the patient. Please refer to After Visit Summary for other counseling recommendations.  Return in about 1 week (around 12/24/2020) for in person ROB.  Nolene Bernheim, RN, MSN, NP-BC Nurse Practitioner, [facility] for [employer], [facility] 12/17/2020 9:32 PM

## 2020-12-17 NOTE — Patient Instructions (Signed)

## 2020-12-18 LAB — GC/CHLAMYDIA PROBE AMP (~~LOC~~) NOT AT ARMC
Chlamydia: NEGATIVE
Comment: NEGATIVE
Comment: NORMAL
Neisseria Gonorrhea: NEGATIVE

## 2020-12-20 ENCOUNTER — Encounter: Payer: Self-pay | Admitting: Nurse Practitioner

## 2020-12-20 DIAGNOSIS — O99820 Streptococcus B carrier state complicating pregnancy: Secondary | ICD-10-CM | POA: Insufficient documentation

## 2020-12-20 LAB — CULTURE, BETA STREP (GROUP B ONLY): Strep Gp B Culture: POSITIVE — AB

## 2020-12-24 ENCOUNTER — Other Ambulatory Visit: Payer: Self-pay

## 2020-12-24 ENCOUNTER — Ambulatory Visit (INDEPENDENT_AMBULATORY_CARE_PROVIDER_SITE_OTHER): Payer: 59 | Admitting: Nurse Practitioner

## 2020-12-24 VITALS — BP 120/74 | HR 99 | Wt 173.3 lb

## 2020-12-24 DIAGNOSIS — B009 Herpesviral infection, unspecified: Secondary | ICD-10-CM

## 2020-12-24 DIAGNOSIS — O99820 Streptococcus B carrier state complicating pregnancy: Secondary | ICD-10-CM

## 2020-12-24 DIAGNOSIS — Z3A37 37 weeks gestation of pregnancy: Secondary | ICD-10-CM

## 2020-12-24 DIAGNOSIS — Z3490 Encounter for supervision of normal pregnancy, unspecified, unspecified trimester: Secondary | ICD-10-CM

## 2020-12-24 NOTE — Patient Instructions (Addendum)
Group B Streptococcus Infection During Pregnancy Group B Streptococcus (GBS) is a type of bacteria that is often found in healthy people. It is commonly found in the rectum, vagina, and intestines. In people who are healthy and not pregnant, the bacteria rarely cause serious illness or complications. However, women who test positive for GBS during pregnancy can pass the bacteria to the baby during childbirth. This can cause serious infection in the baby after birth. Women with GBS may also have infections during their pregnancy or soon after childbirth. The infections include urinary tract infections (UTIs) or infections of the uterus. GBS also increases a woman's risk of complications during pregnancy, such as early labor or delivery, miscarriage, or stillbirth. Routine testing for GBS is recommended for all pregnant women. What are the causes? This condition is caused by bacteria called Streptococcus agalactiae. What increases the risk? You may have a higher risk for GBS infection during pregnancy if you had one during a past pregnancy. What are the signs or symptoms? In most cases, GBS infection does not cause symptoms in pregnant women. If symptoms exist, they may include:  Labor that starts before the 37th week of pregnancy.  A UTI or bladder infection. This may cause a fever, frequent urination, or pain and burning during urination.  Fever during labor. There can also be a rapid heartbeat in the mother or baby. Rare but serious symptoms of a GBS infection in women include:  Blood infection (septicemia). This may cause fever, chills, or confusion.  Lung infection (pneumonia). This may cause fever, chills, cough, rapid breathing, chest pain, or difficulty breathing.  Bone, joint, skin, or soft tissue infection. How is this diagnosed? You may be screened for GBS between week 35 and week 37 of pregnancy. If you have symptoms of preterm labor, you may be screened earlier. This condition is  diagnosed based on lab test results from:  A swab of fluid from the vagina and rectum.  A urine sample. How is this treated? This condition is treated with antibiotic medicine. Antibiotic medicine may be given:  To you when you go into labor, or as soon as your water breaks. The medicines will continue until after you give birth. If you are having a cesarean delivery, you do not need antibiotics unless your water has broken.  To your baby, if he or she requires treatment. Your health care provider will check your baby to decide if he or she needs antibiotics to prevent a serious infection.   Follow these instructions at home:  Take over-the-counter and prescription medicines only as told by your health care provider.  Take your antibiotic medicine as told by your health care provider. Do not stop taking the antibiotic even if you start to feel better.  Keep all pre-birth (prenatal) visits and follow-up visits as told by your health care provider. This is important. Contact a health care provider if:  You have pain or burning when you urinate.  You have to urinate more often than usual.  You have a fever or chills.  You develop a bad-smelling vaginal discharge. Get help right away if:  Your water breaks.  You go into labor.  You have severe pain in your abdomen.  You have difficulty breathing.  You have chest pain. These symptoms may represent a serious problem that is an emergency. Do not wait to see if the symptoms will go away. Get medical help right away. Call your local emergency services (911 in the [place] Do not drive   yourself to the hospital. Summary  GBS is a type of bacteria that is common in healthy people.  During pregnancy, colonization with GBS can cause serious complications for you or your baby.  Your health care provider will screen you between 35 and 37 weeks of pregnancy to determine if you are colonized with GBS.  If you are colonized with GBS during  pregnancy, your health care provider will recommend antibiotics through an IV during labor.  After delivery, your baby will be evaluated for complications related to potential GBS infection and may require antibiotics to prevent a serious infection. This information is not intended to replace advice given to you by your health care provider. Make sure you discuss any questions you have with your health care provider. Document Revised: 07/31/2020 Document Reviewed: 04/25/2019 Elsevier Patient Education  2021 Elsevier Inc.       Signs and Symptoms of Labor Labor is the body's natural process of moving the baby and the placenta out of the uterus. The process of labor usually starts when the baby is full-term, between 59 and 40 weeks of pregnancy. Signs and symptoms that you are close to going into labor As your body prepares for labor and the birth of your baby, you may notice the following symptoms in the weeks and days before true labor starts:  Passing a small amount of thick, bloody mucus from your vagina. This is called normal bloody show or losing your mucus plug. This may happen more than a week before labor begins, or right before labor begins, as the opening of the cervix starts to widen (dilate). For some women, the entire mucus plug passes at once. For others, pieces of the mucus plug may gradually pass over several days.  Your baby moving (dropping) lower in your pelvis to get into position for birth (lightening). When this happens, you may feel more pressure on your bladder and pelvic bone and less pressure on your ribs. This may make it easier to breathe. It may also cause you to need to urinate more often and have problems with bowel movements.  Having "practice contractions," also called Braxton Hicks contractions or false labor. These occur at irregular (unevenly spaced) intervals that are more than 10 minutes apart. False labor contractions are common after exercise or sexual  activity. They will stop if you change position, rest, or drink fluids. These contractions are usually mild and do not get stronger over time. They may feel like: ? A backache or back pain. ? Mild cramps, similar to menstrual cramps. ? Tightening or pressure in your abdomen. Other early symptoms include:  Nausea or loss of appetite.  Diarrhea.  Having a sudden burst of energy, or feeling very tired.  Mood changes.  Having trouble sleeping.   Signs and symptoms that labor has begun Signs that you are in labor may include:  Having contractions that come at regular (evenly spaced) intervals and increase in intensity. This may feel like more intense tightening or pressure in your abdomen that moves to your back. ? Contractions may also feel like rhythmic pain in your upper thighs or back that comes and goes at regular intervals. ? For first-time mothers, this change in intensity of contractions often occurs at a more gradual pace. ? Women who have given birth before may notice a more rapid progression of contraction changes.  Feeling pressure in the vaginal area.  Your water breaking (rupture of membranes). This is when the sac of fluid that surrounds your baby  breaks. Fluid leaking from your vagina may be clear or blood-tinged. Labor usually starts within 24 hours of your water breaking, but it may take longer to begin. ? Some women may feel a sudden gush of fluid. ? Others notice that their underwear repeatedly becomes damp. Follow these instructions at home:  When labor starts, or if your water breaks, call your health care provider or nurse care line. Based on your situation, they will determine when you should go in for an exam.  During early labor, you may be able to rest and manage symptoms at home. Some strategies to try at home include: ? Breathing and relaxation techniques. ? Taking a warm bath or shower. ? Listening to music. ? Using a heating pad on the lower back for  pain. If you are directed to use heat:  Place a towel between your skin and the heat source.  Leave the heat on for 20-30 minutes.  Remove the heat if your skin turns bright red. This is especially important if you are unable to feel pain, heat, or cold. You may have a greater risk of getting burned.   Contact a health care provider if:  Your labor has started.  Your water breaks. Get help right away if:  You have painful, regular contractions that are 5 minutes apart or less.  Labor starts before you are [redacted] weeks along in your pregnancy.  You have a fever.  You have bright red blood coming from your vagina.  You do not feel your baby moving.  You have a severe headache with or without vision problems.  You have severe nausea, vomiting, or diarrhea.  You have chest pain or shortness of breath. These symptoms may represent a serious problem that is an emergency. Do not wait to see if the symptoms will go away. Get medical help right away. Call your local emergency services (911 in the [place] Do not drive yourself to the hospital. Summary  Labor is your body's natural process of moving your baby and the placenta out of your uterus.  The process of labor usually starts when your baby is full-term, between 31 and 40 weeks of pregnancy.  When labor starts, or if your water breaks, call your health care provider or nurse care line. Based on your situation, they will determine when you should go in for an exam. This information is not intended to replace advice given to you by your health care provider. Make sure you discuss any questions you have with your health care provider. Document Revised: 07/21/2020 Document Reviewed: 07/21/2020 Elsevier Patient Education  2021 [employer].

## 2020-12-24 NOTE — Progress Notes (Signed)
    Subjective:  Sheri Walker is a 25 y.o. G2P0010 at [redacted]w[redacted]d being seen today for ongoing prenatal care.  She is currently monitored for the following issues for this low-risk pregnancy and has Asthma; DERMATITIS, ATOPIC; ACNE VULGARIS, FACIAL; PERSONAL HISTORY OF ALLERGY TO PEANUTS; Supervision of low-risk pregnancy; HSV-2 (herpes simplex virus 2) infection; UTI (urinary tract infection) in pregnancy in first trimester; Pyelonephritis affecting pregnancy in second trimester; Hyponatremia; History of hypokalemia; Excessive weight gain in pregnancy in third trimester; and Group B Streptococcus carrier, +RV culture, currently pregnant on their problem list.  Patient reports no complaints.  Contractions: Not present. Vag. Bleeding: None.  Movement: Present. Denies leaking of fluid.   The following portions of the patient's history were reviewed and updated as appropriate: allergies, current medications, past family history, past medical history, past social history, past surgical history and problem list. Problem list updated.  Objective:   Vitals:   12/24/20 1336  BP: 120/74  Pulse: 99  Weight: 173 lb 4.8 oz (78.6 kg)    Fetal Status: Fetal Heart Rate (bpm): 142   Movement: Present     General:  Alert, oriented and cooperative. Patient is in no acute distress.  Skin: Skin is warm and dry. No rash noted.   Cardiovascular: Normal heart rate noted  Respiratory: Normal respiratory effort, no problems with respiration noted  Abdomen: Soft, gravid, appropriate for gestational age. Pain/Pressure: Present     Pelvic:  Cervical exam deferred        Extremities: Normal range of motion.  Edema: None  Mental Status: Normal mood and affect. Normal behavior. Normal judgment and thought content.   Urinalysis:      Assessment and Plan:  Pregnancy: G2P0010 at [redacted]w[redacted]d  1. Encounter for supervision of low-risk pregnancy, antepartum No info in babyscripts BP is normal today Occasional  contractions Weight gain of about 50 pounds in pregnancy but no edema and looks very good  2. HSV-2 (herpes simplex virus 2) infection Taking valtrex suppression  3. Group B Strep in pregnancy Reviewed and info in AVS  Term labor symptoms and general obstetric precautions including but not limited to vaginal bleeding, contractions, leaking of fluid and fetal movement were reviewed in detail with the patient. Please refer to After Visit Summary for other counseling recommendations.  Return in about 1 week (around 12/31/2020) for in person ROB.  Nolene Bernheim, RN, MSN, NP-BC Nurse Practitioner, [facility] for [employer], [facility] 12/24/2020 1:53 PM

## 2021-01-01 ENCOUNTER — Encounter: Payer: Self-pay | Admitting: *Deleted

## 2021-01-01 ENCOUNTER — Ambulatory Visit (INDEPENDENT_AMBULATORY_CARE_PROVIDER_SITE_OTHER): Payer: 59 | Admitting: Nurse Practitioner

## 2021-01-01 ENCOUNTER — Other Ambulatory Visit: Payer: Self-pay

## 2021-01-01 VITALS — BP 135/89 | HR 104 | Wt 178.3 lb

## 2021-01-01 DIAGNOSIS — B009 Herpesviral infection, unspecified: Secondary | ICD-10-CM

## 2021-01-01 DIAGNOSIS — O99820 Streptococcus B carrier state complicating pregnancy: Secondary | ICD-10-CM

## 2021-01-01 DIAGNOSIS — Z3A39 39 weeks gestation of pregnancy: Secondary | ICD-10-CM

## 2021-01-01 DIAGNOSIS — Z3490 Encounter for supervision of normal pregnancy, unspecified, unspecified trimester: Secondary | ICD-10-CM

## 2021-01-01 NOTE — Progress Notes (Signed)
    Subjective:  Sheri Walker is a 25 y.o. G2P0010 at [redacted]w[redacted]d being seen today for ongoing prenatal care.  She is currently monitored for the following issues for this low-risk pregnancy and has Asthma; DERMATITIS, ATOPIC; ACNE VULGARIS, FACIAL; PERSONAL HISTORY OF ALLERGY TO PEANUTS; Supervision of low-risk pregnancy; HSV-2 (herpes simplex virus 2) infection; UTI (urinary tract infection) in pregnancy in first trimester; Pyelonephritis affecting pregnancy in second trimester; Hyponatremia; History of hypokalemia; Excessive weight gain in pregnancy in third trimester; and Group B Streptococcus carrier, +RV culture, currently pregnant on their problem list.  Patient reports no complaints.  Contractions: Irritability. Vag. Bleeding: None.  Movement: Present. Denies leaking of fluid.   The following portions of the patient's history were reviewed and updated as appropriate: allergies, current medications, past family history, past medical history, past social history, past surgical history and problem list. Problem list updated.  Objective:   Vitals:   01/01/21 1415  BP: 135/89  Pulse: (!) 104  Weight: 178 lb 4.8 oz (80.9 kg)    Fetal Status: Fetal Heart Rate (bpm): 164 Fundal Height: 38 cm Movement: Present     General:  Alert, oriented and cooperative. Patient is in no acute distress.  Skin: Skin is warm and dry. No rash noted.   Cardiovascular: Normal heart rate noted  Respiratory: Normal respiratory effort, no problems with respiration noted  Abdomen: Soft, gravid, appropriate for gestational age. Pain/Pressure: Present     Pelvic:  Cervical exam deferred        Extremities: Normal range of motion.  Edema: None  Mental Status: Normal mood and affect. Normal behavior. Normal judgment and thought content.   Urinalysis:      Assessment and Plan:  Pregnancy: G2P0010 at [redacted]w[redacted]d  1. Encounter for supervision of low-risk pregnancy, antepartum Will watch BP Baby is moving well. No  edema, No RUQ pain, No headaches Having occasional contractions Declines cervical exam Will schedule for induction at 41 w. - orders done Will see in one week for BPP and NST at [facility] visit. Reminded that Covid booster is due.  2. HSV-2 (herpes simplex virus 2) infection Taking Valtrex  3. Group B Streptococcus carrier, +RV culture, currently pregnant Will treat in labor  Term labor symptoms and general obstetric precautions including but not limited to vaginal bleeding, contractions, leaking of fluid and fetal movement were reviewed in detail with the patient. Please refer to After Visit Summary for other counseling recommendations.  Return in about 8 days (around 01/09/2021) for in person ROB.  Nolene Bernheim, RN, MSN, NP-BC Nurse Practitioner, [facility] for [employer], [facility] 01/01/2021 2:41 PM

## 2021-01-02 ENCOUNTER — Telehealth (HOSPITAL_COMMUNITY): Payer: Self-pay | Admitting: *Deleted

## 2021-01-02 NOTE — Telephone Encounter (Signed)
Preadmission screen  

## 2021-01-03 ENCOUNTER — Ambulatory Visit (INDEPENDENT_AMBULATORY_CARE_PROVIDER_SITE_OTHER): Payer: Self-pay | Admitting: Pediatrics

## 2021-01-03 ENCOUNTER — Other Ambulatory Visit: Payer: Self-pay

## 2021-01-03 DIAGNOSIS — Z7681 Expectant parent(s) prebirth pediatrician visit: Secondary | ICD-10-CM

## 2021-01-07 ENCOUNTER — Telehealth (HOSPITAL_COMMUNITY): Payer: Self-pay | Admitting: *Deleted

## 2021-01-07 NOTE — Telephone Encounter (Signed)
Preadmission screen  

## 2021-01-08 ENCOUNTER — Inpatient Hospital Stay (HOSPITAL_COMMUNITY): Admit: 2021-01-08 | Payer: Self-pay

## 2021-01-08 ENCOUNTER — Encounter: Payer: 59 | Admitting: Obstetrics and Gynecology

## 2021-01-08 ENCOUNTER — Telehealth (HOSPITAL_COMMUNITY): Payer: Self-pay | Admitting: *Deleted

## 2021-01-08 NOTE — Telephone Encounter (Signed)
Preadmission screen  

## 2021-01-09 ENCOUNTER — Telehealth (HOSPITAL_COMMUNITY): Payer: Self-pay | Admitting: *Deleted

## 2021-01-09 ENCOUNTER — Other Ambulatory Visit: Payer: Self-pay | Admitting: Advanced Practice Midwife

## 2021-01-09 NOTE — Telephone Encounter (Signed)
Preadmission screen  

## 2021-01-10 ENCOUNTER — Ambulatory Visit (INDEPENDENT_AMBULATORY_CARE_PROVIDER_SITE_OTHER): Payer: 59 | Admitting: *Deleted

## 2021-01-10 ENCOUNTER — Ambulatory Visit (INDEPENDENT_AMBULATORY_CARE_PROVIDER_SITE_OTHER): Payer: 59 | Admitting: Obstetrics and Gynecology

## 2021-01-10 ENCOUNTER — Other Ambulatory Visit: Payer: Self-pay

## 2021-01-10 ENCOUNTER — Telehealth (HOSPITAL_COMMUNITY): Payer: Self-pay | Admitting: *Deleted

## 2021-01-10 ENCOUNTER — Encounter (HOSPITAL_COMMUNITY): Payer: Self-pay | Admitting: *Deleted

## 2021-01-10 ENCOUNTER — Ambulatory Visit: Payer: Self-pay

## 2021-01-10 VITALS — BP 133/86 | HR 106 | Wt 181.2 lb

## 2021-01-10 DIAGNOSIS — O480 Post-term pregnancy: Secondary | ICD-10-CM

## 2021-01-10 DIAGNOSIS — Z3490 Encounter for supervision of normal pregnancy, unspecified, unspecified trimester: Secondary | ICD-10-CM

## 2021-01-10 NOTE — Progress Notes (Signed)
Prenatal counseling for impending newborn done--  Reviewed current vaccine policy and answered all questions.  1st child, Currently 39 weeks, Current complications:  none, Prenatal care initiated:  8wks Z76.81

## 2021-01-10 NOTE — Progress Notes (Signed)
IOL scheduled on 4/5.

## 2021-01-10 NOTE — Telephone Encounter (Signed)
Preadmission screen  

## 2021-01-10 NOTE — Progress Notes (Signed)
   PRENATAL VISIT NOTE  Subjective:  NINFA GIANNELLI is a 25 y.o. G2P0010 at [redacted]w[redacted]d being seen today for ongoing prenatal care.  She is currently monitored for the following issues for this low-risk pregnancy and has Asthma; DERMATITIS, ATOPIC; ACNE VULGARIS, FACIAL; PERSONAL HISTORY OF ALLERGY TO PEANUTS; Supervision of low-risk pregnancy; HSV-2 (herpes simplex virus 2) infection; UTI (urinary tract infection) in pregnancy in first trimester; Pyelonephritis affecting pregnancy in second trimester; Hyponatremia; History of hypokalemia; Excessive weight gain in pregnancy in third trimester; and Group B Streptococcus carrier, +RV culture, currently pregnant on their problem list.  Patient reports no complaints.  Contractions: Irregular. Vag. Bleeding: None.  Movement: Present. Denies leaking of fluid.   The following portions of the patient's history were reviewed and updated as appropriate: allergies, current medications, past family history, past medical history, past social history, past surgical history and problem list.   Objective:   Vitals:   01/10/21 1330  BP: 133/86  Pulse: (!) 106  Weight: 181 lb 3.2 oz (82.2 kg)    Fetal Status: Fetal Heart Rate (bpm): NST   Movement: Present     General:  Alert, oriented and cooperative. Patient is in no acute distress.  Skin: Skin is warm and dry. No rash noted.   Cardiovascular: Normal heart rate noted  Respiratory: Normal respiratory effort, no problems with respiration noted  Abdomen: Soft, gravid, appropriate for gestational age.  Pain/Pressure: Present     Pelvic: Cervical exam deferred        Extremities: Normal range of motion.  Edema: None  Mental Status: Normal mood and affect. Normal behavior. Normal judgment and thought content.   Assessment and Plan:  Pregnancy: G2P0010 at [redacted]w[redacted]d 1. Encounter for supervision of low-risk pregnancy, antepartum Routine care. Pt has post dates IOL for 4/5  2. Post term pregnancy, antepartum  condition or complication bpp 10/10 today. Continue valtrex ppx  Term labor symptoms and general obstetric precautions including but not limited to vaginal bleeding, contractions, leaking of fluid and fetal movement were reviewed in detail with the patient. Please refer to After Visit Summary for other counseling recommendations.   Return if symptoms worsen or fail to improve.  Future Appointments  Date Time Provider Department Center  01/10/2021  2:35 PM [facility] US1 [place] [facility]  01/14/2021  9:45 AM [facility]-SCREENING [facility] None  01/15/2021  9:10 AM [facility]-LD SCHED ROOM [facility] None    Humeston Bing, MD

## 2021-01-12 ENCOUNTER — Inpatient Hospital Stay (HOSPITAL_COMMUNITY)
Admission: AD | Admit: 2021-01-12 | Discharge: 2021-01-15 | DRG: 806 | Disposition: A | Discharge status: Home / Self Care | Payer: 59 | Attending: Obstetrics and Gynecology | Admitting: Obstetrics and Gynecology

## 2021-01-12 ENCOUNTER — Other Ambulatory Visit: Payer: Self-pay

## 2021-01-12 ENCOUNTER — Encounter (HOSPITAL_COMMUNITY): Payer: Self-pay | Admitting: Obstetrics and Gynecology

## 2021-01-12 DIAGNOSIS — O9832 Other infections with a predominantly sexual mode of transmission complicating childbirth: Secondary | ICD-10-CM | POA: Diagnosis not present

## 2021-01-12 DIAGNOSIS — O4202 Full-term premature rupture of membranes, onset of labor within 24 hours of rupture: Secondary | ICD-10-CM | POA: Diagnosis not present

## 2021-01-12 DIAGNOSIS — Z3A40 40 weeks gestation of pregnancy: Secondary | ICD-10-CM

## 2021-01-12 DIAGNOSIS — O480 Post-term pregnancy: Secondary | ICD-10-CM | POA: Diagnosis not present

## 2021-01-12 DIAGNOSIS — O4292 Full-term premature rupture of membranes, unspecified as to length of time between rupture and onset of labor: Secondary | ICD-10-CM | POA: Diagnosis not present

## 2021-01-12 DIAGNOSIS — O26893 Other specified pregnancy related conditions, third trimester: Secondary | ICD-10-CM | POA: Diagnosis present

## 2021-01-12 DIAGNOSIS — Z3A41 41 weeks gestation of pregnancy: Secondary | ICD-10-CM | POA: Diagnosis not present

## 2021-01-12 DIAGNOSIS — Z20822 Contact with and (suspected) exposure to covid-19: Secondary | ICD-10-CM | POA: Diagnosis present

## 2021-01-12 DIAGNOSIS — A6000 Herpesviral infection of urogenital system, unspecified: Secondary | ICD-10-CM | POA: Diagnosis not present

## 2021-01-12 DIAGNOSIS — O99824 Streptococcus B carrier state complicating childbirth: Secondary | ICD-10-CM | POA: Diagnosis not present

## 2021-01-12 DIAGNOSIS — O99820 Streptococcus B carrier state complicating pregnancy: Secondary | ICD-10-CM | POA: Diagnosis not present

## 2021-01-12 LAB — TYPE AND SCREEN
ABO/RH(D): O POS
Antibody Screen: NEGATIVE

## 2021-01-12 LAB — PROTEIN / CREATININE RATIO, URINE
Creatinine, Urine: 36.43 mg/dL
Protein Creatinine Ratio: 0.58 mg/mg{Cre} — ABNORMAL HIGH (ref 0.00–0.15)
Total Protein, Urine: 21 mg/dL

## 2021-01-12 LAB — RESP PANEL BY RT-PCR (FLU A&B, COVID) ARPGX2
Influenza A by PCR: NEGATIVE
Influenza B by PCR: NEGATIVE
SARS Coronavirus 2 by RT PCR: NEGATIVE

## 2021-01-12 LAB — CBC
HCT: 41.8 % (ref 36.0–46.0)
Hemoglobin: 13.9 g/dL (ref 12.0–15.0)
MCH: 33.2 pg (ref 26.0–34.0)
MCHC: 33.3 g/dL (ref 30.0–36.0)
MCV: 99.8 fL (ref 80.0–100.0)
Platelets: 212 10*3/uL (ref 150–400)
RBC: 4.19 MIL/uL (ref 3.87–5.11)
RDW: 13.1 % (ref 11.5–15.5)
WBC: 12.3 10*3/uL — ABNORMAL HIGH (ref 4.0–10.5)
nRBC: 0 % (ref 0.0–0.2)

## 2021-01-12 LAB — URINALYSIS, ROUTINE W REFLEX MICROSCOPIC
Bilirubin Urine: NEGATIVE
Glucose, UA: NEGATIVE mg/dL
Ketones, ur: NEGATIVE mg/dL
Nitrite: NEGATIVE
Protein, ur: NEGATIVE mg/dL
Specific Gravity, Urine: 1.008 (ref 1.005–1.030)
pH: 7 (ref 5.0–8.0)

## 2021-01-12 LAB — COMPREHENSIVE METABOLIC PANEL
ALT: 24 U/L (ref 0–44)
AST: 21 U/L (ref 15–41)
Albumin: 3.1 g/dL — ABNORMAL LOW (ref 3.5–5.0)
Alkaline Phosphatase: 103 U/L (ref 38–126)
Anion gap: 12 (ref 5–15)
BUN: 6 mg/dL (ref 6–20)
CO2: 17 mmol/L — ABNORMAL LOW (ref 22–32)
Calcium: 9 mg/dL (ref 8.9–10.3)
Chloride: 105 mmol/L (ref 98–111)
Creatinine, Ser: 0.67 mg/dL (ref 0.44–1.00)
GFR, Estimated: >60 mL/min (ref >60)
Glucose, Bld: 136 mg/dL — ABNORMAL HIGH (ref 70–99)
Potassium: 3.4 mmol/L — ABNORMAL LOW (ref 3.5–5.1)
Sodium: 134 mmol/L — ABNORMAL LOW (ref 135–145)
Total Bilirubin: 0.3 mg/dL (ref 0.3–1.2)
Total Protein: 6.1 g/dL — ABNORMAL LOW (ref 6.5–8.1)

## 2021-01-12 LAB — POCT FERN TEST: POCT Fern Test: POSITIVE

## 2021-01-12 MED ORDER — LACTATED RINGERS IV SOLN: INTRAVENOUS | Status: DC

## 2021-01-12 MED ORDER — MISOPROSTOL 25 MCG QUARTER TABLET: 25.0000 ug | ORAL_TABLET | ORAL | Status: DC | PRN

## 2021-01-12 MED ORDER — PENICILLIN G POT IN DEXTROSE 60000 UNIT/ML IV SOLN
3.0000 10*6.[IU] | INTRAVENOUS | Status: DC
  Administered 2021-01-12 – 2021-01-13 (×7): 3 10*6.[IU] via INTRAVENOUS
  Filled 2021-01-12 (×7): qty 50

## 2021-01-12 MED ORDER — OXYTOCIN-SODIUM CHLORIDE 30-0.9 UT/500ML-% IV SOLN
2.5000 [IU]/h | INTRAVENOUS | Status: DC
  Administered 2021-01-14: 2.5 [IU]/h via INTRAVENOUS
  Filled 2021-01-12: qty 500

## 2021-01-12 MED ORDER — OXYCODONE-ACETAMINOPHEN 5-325 MG PO TABS: 2.0000 | ORAL_TABLET | ORAL | Status: DC | PRN

## 2021-01-12 MED ORDER — VALACYCLOVIR HCL 500 MG PO TABS: 500.0000 mg | ORAL_TABLET | Freq: Two times a day (BID) | ORAL | Status: DC

## 2021-01-12 MED ORDER — LIDOCAINE HCL (PF) 1 % IJ SOLN
30.0000 mL | INTRAMUSCULAR | Status: DC | PRN
  Filled 2021-01-12: qty 30

## 2021-01-12 MED ORDER — OXYCODONE-ACETAMINOPHEN 5-325 MG PO TABS: 1.0000 | ORAL_TABLET | ORAL | Status: DC | PRN

## 2021-01-12 MED ORDER — ACETAMINOPHEN 325 MG PO TABS: 650.0000 mg | ORAL_TABLET | ORAL | Status: DC | PRN

## 2021-01-12 MED ORDER — LACTATED RINGERS IV SOLN
500.0000 mL | INTRAVENOUS | Status: DC | PRN
  Administered 2021-01-13: 500 mL via INTRAVENOUS

## 2021-01-12 MED ORDER — SODIUM CHLORIDE 0.9 % IV SOLN
5.0000 10*6.[IU] | Freq: Once | INTRAVENOUS | Status: AC
  Administered 2021-01-12: 5 10*6.[IU] via INTRAVENOUS
  Filled 2021-01-12: qty 5

## 2021-01-12 MED ORDER — OXYTOCIN-SODIUM CHLORIDE 30-0.9 UT/500ML-% IV SOLN
1.0000 m[IU]/min | INTRAVENOUS | Status: DC
  Administered 2021-01-12: 2 m[IU]/min via INTRAVENOUS
  Filled 2021-01-12: qty 500

## 2021-01-12 MED ORDER — TERBUTALINE SULFATE 1 MG/ML IJ SOLN: 0.2500 mg | Freq: Once | INTRAMUSCULAR | Status: DC | PRN

## 2021-01-12 MED ORDER — FLEET ENEMA 7-19 GM/118ML RE ENEM: 1.0000 | ENEMA | RECTAL | Status: DC | PRN

## 2021-01-12 MED ORDER — ONDANSETRON HCL 4 MG/2ML IJ SOLN: 4.0000 mg | Freq: Four times a day (QID) | INTRAMUSCULAR | Status: DC | PRN

## 2021-01-12 MED ORDER — OXYTOCIN BOLUS FROM INFUSION
333.0000 mL | Freq: Once | INTRAVENOUS | Status: AC
  Administered 2021-01-14: 333 mL via INTRAVENOUS

## 2021-01-12 MED ORDER — SOD CITRATE-CITRIC ACID 500-334 MG/5ML PO SOLN: 30.0000 mL | ORAL | Status: DC | PRN

## 2021-01-12 NOTE — MAU Note (Signed)
Pt reports to mau with c/o SROM around 0830 this morning.  Pt states fluid was light yellow.  Reports irregular ctx and some mucous like dc.  +FM

## 2021-01-12 NOTE — Progress Notes (Signed)
Sheri Walker is a 25 y.o. G2P0010 at [redacted]w[redacted]d   Subjective: Endorses irregular contractions, pain score essentially unchanged from admission exam. Coping very well with frequent position changes and distractions. Fiance and patient's mom at bedside and supportive.  Objective: BP (!) 111/95   Pulse 91   Temp 97.7 F (36.5 C) (Oral)   Resp 17   Ht 5\' 3"  (1.6 m)   Wt 82.1 kg   LMP 04/03/2020 (Exact Date)   SpO2 98%   BMI 32.06 kg/m  No intake/output data recorded. No intake/output data recorded.  FHT:  FHR: 145 bpm, variability: moderate,  accelerations:  Present,  decelerations:  Absent UC:   irregular, every 2-5 minutes SVE:   Dilation: 4 Effacement (%): 80 Station: -2 Exam by:: [email or web address], CNM  Labs: Lab Results  Component Value Date   WBC 12.3 (H) 01/12/2021   HGB 13.9 01/12/2021   HCT 41.8 01/12/2021   MCV 99.8 01/12/2021   PLT 212 01/12/2021    Assessment / Plan: --25 y.o. G2P0010 at [redacted]w[redacted]d  --Cat I tracing --GBS POS, PCN ordered --S/p SROM at 0830 today, clear fluid afebrile --Elevated BP x 2. No history of HTN. PEC labs with abnormal P:Cr but Hgb noted in sample. Will reaccomplish P:Cr if additional elevated BP --Pitocin initiated at 1515, small cervical change, continue to titrate PRN -- [redacted]w[redacted]d, CNM 01/12/2021, 7:00 PM

## 2021-01-12 NOTE — Progress Notes (Signed)
Sheri Walker is a 25 y.o. G2P0010 at [redacted]w[redacted]d   Subjective: Sitting on birthing ball, Mother and fiance at bedside and engaged in care  Objective: BP 132/82 (BP Location: Right Arm)   Pulse 97   Temp 99.3 F (37.4 C) (Axillary)   Resp 17   Ht 5\' 3"  (1.6 m)   Wt 82.1 kg   LMP 04/03/2020 (Exact Date)   SpO2 98%   BMI 32.06 kg/m  No intake/output data recorded. No intake/output data recorded.  FHT:  FHR: 145 bpm, variability: moderate,  accelerations:  Present,  decelerations:  Absent UC:   irregular, every 4-6 minutes SVE:   Dilation: 3.5 Effacement (%): 80 Station: -2 Exam by:: [email or web address], CNM  Labs: Lab Results  Component Value Date   WBC 12.3 (H) 01/12/2021   HGB 13.9 01/12/2021   HCT 41.8 01/12/2021   MCV 99.8 01/12/2021   PLT 212 01/12/2021    Assessment / Plan: -25 y.o. G2P0010 at [redacted]w[redacted]d  -Cat I tracing -GBS +, initial dose still hanging, not infused, not adequate yet -S/p SROM 0830 today, clear fluid, afebrile -Cervix unchanged from previous exam 4 hours ago -Advised Pitocin augmentation  -Desires unmedicated birth, will titrate 1 x 1 -Patient to have small snack, initiate Pitocin within 30 minute window per policy - Anticipate NSVD  [redacted]w[redacted]d, CNM 01/12/2021, 3:06 PM

## 2021-01-12 NOTE — H&P (Signed)
Sheri Walker is a 25 y.o. female presenting for SROM at 0830 this morning. She endorses irregular mild contractions since that time. She denies vaginal bleeding, decreased fetal movement, fever, falls, or recent illness.  . OB History    Gravida  2   Para  0   Term  0   Preterm  0   AB  1   Living  0     SAB  0   IAB  1   Ectopic  0   Multiple  0   Live Births  0          Nursing Staff Provider  Office Location [facility] Dating  LMP (regular cycles)  Language  English  Anatomy US  Normal (gender undisclosed)  Flu Vaccine  06/25/20 Genetic Screen  NIPS: declined   AFP: neg     TDaP Vaccine  11/05/20 Hgb A1C or  GTT Early  Third trimester 79-102-103  COVID Vaccine Pfizer 01/14/20, 02/07/20   LAB RESULTS   Rhogam  NA Blood Type   O pos  Feeding Plan Breast Antibody   neg  Contraception IUD Rubella  Imm  Circumcision NA (girl) RPR   NR  Pediatrician  [facility] ([place]) HBsAg    Neg  Support Person FOB HCVAb Negative  Prenatal Classes offered HIV     Neg  BTL Consent NA GBS  POS   VBAC Consent NA Pap  pap normal 06/2020    Hgb Electro   Declined  BP Cuff Has BP cuff CF  Declined    SMA  Declined   Past Medical History:  Diagnosis Date  . Acne   . Allergy   . Asthma   . Asthma    [employer] 01/02/2021  . History of pyelonephritis during pregnancy   . HSV-2 (herpes simplex virus 2) infection 2016   Past Surgical History:  Procedure Laterality Date  . NO PAST SURGERIES     Family History: family history includes Allergies in her mother; Hyperlipidemia in her father. Social History:  reports that she has never smoked. She has never used smokeless tobacco. She reports previous alcohol use. She reports previous drug use. Drug: Marijuana.     Maternal Diabetes: No Genetic Screening: Declined Maternal Ultrasounds/Referrals: Normal Fetal Ultrasounds or other Referrals:  None Maternal Substance Abuse:  No Significant Maternal Medications:   Valtrex for HSV prophylaxis, initiated at 36 weeks,  Significant Maternal Lab Results:  Group B Strep positive Other Comments:  None  Review of Systems  Gastrointestinal: Positive for abdominal pain.  Genitourinary: Positive for vaginal discharge.  All other systems reviewed and are negative.  Dilation: 3 Exam by:: S. Keen Ewalt CNM Blood pressure (!) 143/86, pulse (!) 117, temperature 97.9 F (36.6 C), temperature source Oral, resp. rate 16, height 5\' 3"  (1.6 m), weight 82.3 kg, last menstrual period 04/03/2020, SpO2 98 %. Maternal Exam:  Introitus: Normal vulva.   Physical Exam Vitals and nursing note reviewed. Exam conducted with a chaperone present.  Constitutional:      Appearance: Normal appearance.  Cardiovascular:     Rate and Rhythm: Normal rate.     Pulses: Normal pulses.     Heart sounds: Normal heart sounds.  Pulmonary:     Effort: Pulmonary effort is normal.     Breath sounds: Normal breath sounds.  Abdominal:     Comments: Gravid  Genitourinary:    General: Normal vulva.     Comments: SSE no lesions noted Skin:  Capillary Refill: Capillary refill takes less than 2 seconds.  Neurological:     Mental Status: She is alert and oriented to person, place, and time.  Psychiatric:        Mood and Affect: Mood normal.        Behavior: Behavior normal.        Thought Content: Thought content normal.        Judgment: Judgment normal.     Prenatal labs: ABO, Rh: --/--/O POS (11/24 1243) Antibody: NEG (11/24 1243) Rubella: 1.15 (09/13 1310) RPR: Non Reactive (01/24 0828)  HBsAg: Negative (09/13 1310)  HIV: Non Reactive (01/24 0828)  GBS: Positive/-- (03/07 1654)   Assessment/Plan: --25 y.o. G2P0010 at [redacted]w[redacted]d  --S/p SROM at 0830 today --Cat I tracing --GBS POS, PCN ordered --3-3.5/thin/cephalic by suture --HSV, on suppression, s/p SSE in MAU, no lesions visualized --Assess for spontaneous cervical change --Plan to reassess cervix in 4-6 hours, consider  Pitocin augmentation if unchanged. Pt agreeable --Desires unmedicated labor --Birth plan reviewed --Anticipate NSVD  Girl/breast/outpatient IUD  Calvert Cantor, CNM 01/12/2021, 11:45 AM

## 2021-01-13 LAB — PROTEIN / CREATININE RATIO, URINE
Creatinine, Urine: 43.92 mg/dL
Protein Creatinine Ratio: 0.18 mg/mg{Cre} — ABNORMAL HIGH (ref 0.00–0.15)
Total Protein, Urine: 8 mg/dL

## 2021-01-13 LAB — RPR: RPR Ser Ql: NONREACTIVE

## 2021-01-13 NOTE — Progress Notes (Signed)
Labor Progress Note Sheri Walker is a 25 y.o. G2P0010 at [redacted]w[redacted]d presented for PROM S: Breathing well through contractions   O:  BP (!) 104/56   Pulse 89   Temp 97.8 F (36.6 C) (Oral)   Resp 17   Ht 5\' 3"  (1.6 m)   Wt 82.1 kg   LMP 04/03/2020 (Exact Date)   SpO2 98%   BMI 32.06 kg/m  EFM: 135/mod to min variability/pos accels/ no decels  Toco: q2-3 min   CVE: Dilation: 6.5 Effacement (%): 80,90 Cervical Position: Posterior Station: -1 Presentation: Vertex Exam by:: Quin Lofton, RN   A&P: 25 y.o. G2P0010 [redacted]w[redacted]d here for IOL for PROM  #Labor: progressing well. s/p ROM at 0830 on 4/1, pitocin started at 1520, currently running at 14. Continue to titrate pitocin.   #Pain: desires unmedicated  #FWB: cat I  #GBS positive, PCN     6/1, MD 5:12 AM

## 2021-01-13 NOTE — Progress Notes (Signed)
Sheri Walker is a 25 y.o. G2P0010 at [redacted]w[redacted]d.  Subjective: Coping well w. UC's w/ comfort measures. States UC's not as strong as last night.   Objective: BP 111/66   Pulse 91   Temp 98 F (36.7 C) (Oral)   Resp 18   Ht 5\' 3"  (1.6 m)   Wt 82.1 kg   LMP 04/03/2020 (Exact Date)   SpO2 98%   BMI 32.06 kg/m    FHT:  FHR: 145 bpm, variability: mod,  accelerations:  15x15,  decelerations:  none UC:   Q 1.5-5 minutes, mod. Toco not currently working. RN finding replacement.  Dilation: 8 Effacement (%): 80 Cervical Position: Posterior Station: 0 Presentation: Vertex Exam by:: [email or web address]: Results for orders placed or performed during the hospital encounter of 01/12/21 (from the past 24 hour(s))  Urinalysis, Routine w reflex microscopic Urine, Clean Catch     Status: Abnormal   Collection Time: 01/12/21 11:08 AM  Result Value Ref Range   Color, Urine YELLOW YELLOW   APPearance CLOUDY (A) CLEAR   Specific Gravity, Urine 1.008 1.005 - 1.030   pH 7.0 5.0 - 8.0   Glucose, UA NEGATIVE NEGATIVE mg/dL   Hgb urine dipstick MODERATE (A) NEGATIVE   Bilirubin Urine NEGATIVE NEGATIVE   Ketones, ur NEGATIVE NEGATIVE mg/dL   Protein, ur NEGATIVE NEGATIVE mg/dL   Nitrite NEGATIVE NEGATIVE   Leukocytes,Ua SMALL (A) NEGATIVE   RBC / HPF 21-50 0 - 5 RBC/hpf   WBC, UA 21-50 0 - 5 WBC/hpf   Bacteria, UA RARE (A) NONE SEEN   Squamous Epithelial / LPF 11-20 0 - 5   Mucus PRESENT   Protein / creatinine ratio, urine     Status: Abnormal   Collection Time: 01/12/21 11:08 AM  Result Value Ref Range   Creatinine, Urine 36.43 mg/dL   Total Protein, Urine 21 mg/dL   Protein Creatinine Ratio 0.58 (H) 0.00 - 0.15 mg/mg[Cre]  Resp Panel by RT-PCR (Flu A&B, Covid) Nasopharyngeal Swab     Status: None   Collection Time: 01/12/21 11:09 AM   Specimen: Nasopharyngeal Swab; Nasopharyngeal(NP) swabs in vial transport medium  Result Value Ref Range   SARS Coronavirus 2 by RT PCR NEGATIVE NEGATIVE    Influenza A by PCR NEGATIVE NEGATIVE   Influenza B by PCR NEGATIVE NEGATIVE  CBC     Status: Abnormal   Collection Time: 01/12/21 11:10 AM  Result Value Ref Range   WBC 12.3 (H) 4.0 - 10.5 K/uL   RBC 4.19 3.87 - 5.11 MIL/uL   Hemoglobin 13.9 12.0 - 15.0 g/dL   HCT 03/14/21 54.4 - 92.0 %   MCV 99.8 80.0 - 100.0 fL   MCH 33.2 26.0 - 34.0 pg   MCHC 33.3 30.0 - 36.0 g/dL   RDW 10.0 71.2 - 19.7 %   Platelets 212 150 - 400 K/uL   nRBC 0.0 0.0 - 0.2 %  Comprehensive metabolic panel     Status: Abnormal   Collection Time: 01/12/21 11:10 AM  Result Value Ref Range   Sodium 134 (L) 135 - 145 mmol/L   Potassium 3.4 (L) 3.5 - 5.1 mmol/L   Chloride 105 98 - 111 mmol/L   CO2 17 (L) 22 - 32 mmol/L   Glucose, Bld 136 (H) 70 - 99 mg/dL   BUN 6 6 - 20 mg/dL   Creatinine, Ser 03/14/21 0.44 - 1.00 mg/dL   Calcium 9.0 8.9 - 3.25 mg/dL   Total Protein 6.1 (L) 6.5 -  8.1 g/dL   Albumin 3.1 (L) 3.5 - 5.0 g/dL   AST 21 15 - 41 U/L   ALT 24 0 - 44 U/L   Alkaline Phosphatase 103 38 - 126 U/L   Total Bilirubin 0.3 0.3 - 1.2 mg/dL   GFR, Estimated >65 >46 mL/min   Anion gap 12 5 - 15  Type and screen     Status: None   Collection Time: 01/12/21 11:34 AM  Result Value Ref Range   ABO/RH(D) O POS    Antibody Screen NEG    Sample Expiration      01/15/2021,2359 Performed at [facility], [address] [place], [place] [address]   Crist Fat Test     Status: Abnormal   Collection Time: 01/12/21 12:15 PM  Result Value Ref Range   POCT Fern Test Positive = ruptured amniotic membanes     Assessment / Plan: [redacted]w[redacted]d week IUP Labor: Protracted active phase. Replace Toco and continue titrating pitocin to achieve adequate UC's. (Pt had previsously requested Pitocin not be increased.) Discussed IUPC at next check if no change.  Fetal Wellbeing:  Category I Pain Control:  Comfort measures.  Anticipated MOD:  SVD Elevated BP: No more elevated BPs. Labs Nml except Elevated P:C but suspect contamination w/ amniotic  fluid. Would not meet criteria for severe Pre-E either way. Will discuss if there is need for recollection w/ attending since it will not change POC at this time and pt will not likely have a catheter to collect a clean specimen.   Katrinka Blazing, [place], CNM 01/13/2021 11:02 AM

## 2021-01-13 NOTE — Progress Notes (Signed)
Labor Progress Note Sheri Walker is a 25 y.o. G2P0010 at [redacted]w[redacted]d presented for PROM S: Breathing through contractions   O:  BP 116/86   Pulse 83   Temp 97.8 F (36.6 C) (Axillary)   Resp 16   Ht 5\' 3"  (1.6 m)   Wt 82.1 kg   LMP 04/03/2020 (Exact Date)   SpO2 98%   BMI 32.06 kg/m  EFM: 135/mod variability/pos accels/ no decels  Toco: q2-3 min   CVE: Dilation: 4 Effacement (%): 80 Cervical Position: Posterior Station: -2 Presentation: Vertex Exam by:: [email or web address], CNM   A&P: 25 y.o. G2P0010 [redacted]w[redacted]d here for IOL for PROM  #Labor: s/p ROM at 0830 on 4/1, pitocin started at 1520, currently running at 10. Continue to titrate pitocin.  #Pain: desires unmedicated  #FWB: cat I  #GBS positive, PCN     6/1, MD 12:44 AM

## 2021-01-13 NOTE — Progress Notes (Signed)
LARISA LANIUS is a 25 y.o. G2P0010 at [redacted]w[redacted]d.  Subjective: Coping well. Reports mild pressure.   Objective: BP 131/85   Pulse 100   Temp 97.9 F (36.6 C) (Axillary)   Resp 19   Ht 5\' 3"  (1.6 m)   Wt 82.1 kg   LMP 04/03/2020 (Exact Date)   SpO2 98%   BMI 32.06 kg/m    FHT:  FHR: 145 bpm, variability: mod,  accelerations:  15x15,  decelerations:  Mild variables UC:   Q 2-4 minutes, mod Dilation: 9 Effacement (%): 100 Cervical Position: Posterior Station: 0 Presentation: Vertex Exam by:: [email or web address] RN  Labs: Results for orders placed or performed during the hospital encounter of 01/12/21 (from the past 24 hour(s))  Protein / creatinine ratio, urine     Status: Abnormal   Collection Time: 01/13/21 11:50 AM  Result Value Ref Range   Creatinine, Urine 43.92 mg/dL   Total Protein, Urine 8 mg/dL   Protein Creatinine Ratio 0.18 (H) 0.00 - 0.15 mg/mg[Cre]   Assessment / Plan: [redacted]w[redacted]d week IUP Labor: Transition/protracted active phase. Minimal change over 8 hours. Pt not wanting to increase pitocin due to pain, but contractions not palpating strong.  Fetal Wellbeing:  Category I-II, overall reassuring Pain Control:  Comfort measures. Anticipated MOD:  Uncertain. Will encourage pt to consider IUPC again if not making change at next check.   [redacted]w[redacted]d, Katrinka Blazing, CNM 01/13/2021 5:00 PM

## 2021-01-13 NOTE — Progress Notes (Signed)
Sheri Walker is a 25 y.o. G2P0010 at 105w5d.  Subjective: Involuntary urge to push.   Objective: BP 131/85   Pulse 100   Temp 98.9 F (37.2 C) (Axillary)   Resp 19   Ht 5\' 3"  (1.6 m)   Wt 82.1 kg   LMP 04/03/2020 (Exact Date)   SpO2 98%   BMI 32.06 kg/m    FHT:  FHR: 144 bpm, variability: mod,  accelerations:  10x10,  decelerations:  Mild variables UC:   Q 2-5 minutes, strong Dilation: 9 cm reduced to anterior lip Effacement (%): 100 Station: Plus 1 Presentation: Vertex Exam by:: [email or web address], CNM  Labs: NA  Assessment / Plan: 102w5d week IUP Labor: Transition. ROM x 31 hours w/ no evidence of triple I. Tried pushing w/ a few contractions. Pt pushes well and CMN able to to reduce lip but it would not stay back. Will have pt push as little as possible until Dr. [redacted]w[redacted]d can come take CNM's place. Expressed concern w/ pt and family that there has been slow progress all day. Pelvis feels adequate. Still unsure if UC's are adequate but pt has desired low intervention. May Need to recommend IUPC again and increase in Pitocin PRN if not close to delivering soon. Dr. Myriam Jacobson updated.  Fetal Wellbeing:  Category I-II, overall reassuring w/ mod variability, spontaneous accels, + scalp stim.  Pain Control:  Comfort measures Anticipated MOD:  Uncertain.   Alysia Penna, Katrinka Blazing, [place] 01/13/2021 7:52 PM

## 2021-01-14 ENCOUNTER — Encounter (HOSPITAL_COMMUNITY): Payer: Self-pay | Admitting: Family Medicine

## 2021-01-14 ENCOUNTER — Other Ambulatory Visit (HOSPITAL_COMMUNITY): Payer: 59 | Attending: Family Medicine

## 2021-01-14 DIAGNOSIS — O480 Post-term pregnancy: Secondary | ICD-10-CM

## 2021-01-14 DIAGNOSIS — O4202 Full-term premature rupture of membranes, onset of labor within 24 hours of rupture: Secondary | ICD-10-CM

## 2021-01-14 DIAGNOSIS — Z3A41 41 weeks gestation of pregnancy: Secondary | ICD-10-CM

## 2021-01-14 DIAGNOSIS — O99820 Streptococcus B carrier state complicating pregnancy: Secondary | ICD-10-CM

## 2021-01-14 MED ORDER — COCONUT OIL OIL: 1.0000 "application " | TOPICAL_OIL | Status: DC | PRN

## 2021-01-14 MED ORDER — DIBUCAINE (PERIANAL) 1 % EX OINT: 1.0000 "application " | TOPICAL_OINTMENT | CUTANEOUS | Status: DC | PRN

## 2021-01-14 MED ORDER — SIMETHICONE 80 MG PO CHEW: 80.0000 mg | CHEWABLE_TABLET | ORAL | Status: DC | PRN

## 2021-01-14 MED ORDER — TETANUS-DIPHTH-ACELL PERTUSSIS 5-2.5-18.5 LF-MCG/0.5 IM SUSY: 0.5000 mL | PREFILLED_SYRINGE | Freq: Once | INTRAMUSCULAR | Status: DC

## 2021-01-14 MED ORDER — DOCUSATE SODIUM 100 MG PO CAPS
100.0000 mg | ORAL_CAPSULE | Freq: Two times a day (BID) | ORAL | Status: DC
  Administered 2021-01-14: 100 mg via ORAL
  Filled 2021-01-14: qty 1

## 2021-01-14 MED ORDER — DIPHENHYDRAMINE HCL 25 MG PO CAPS: 25.0000 mg | ORAL_CAPSULE | Freq: Four times a day (QID) | ORAL | Status: DC | PRN

## 2021-01-14 MED ORDER — IBUPROFEN 600 MG PO TABS
600.0000 mg | ORAL_TABLET | Freq: Four times a day (QID) | ORAL | Status: DC
  Administered 2021-01-14 – 2021-01-15 (×5): 600 mg via ORAL
  Filled 2021-01-14 (×6): qty 1

## 2021-01-14 MED ORDER — WITCH HAZEL-GLYCERIN EX PADS: 1.0000 "application " | MEDICATED_PAD | CUTANEOUS | Status: DC | PRN

## 2021-01-14 MED ORDER — ONDANSETRON HCL 4 MG PO TABS: 4.0000 mg | ORAL_TABLET | ORAL | Status: DC | PRN

## 2021-01-14 MED ORDER — BENZOCAINE-MENTHOL 20-0.5 % EX AERO: 1.0000 "application " | INHALATION_SPRAY | CUTANEOUS | Status: DC | PRN

## 2021-01-14 MED ORDER — PRENATAL MULTIVITAMIN CH
1.0000 | ORAL_TABLET | Freq: Every day | ORAL | Status: DC
  Administered 2021-01-14 – 2021-01-15 (×2): 1 via ORAL
  Filled 2021-01-14 (×2): qty 1

## 2021-01-14 MED ORDER — ACETAMINOPHEN 325 MG PO TABS: 650.0000 mg | ORAL_TABLET | ORAL | Status: DC | PRN

## 2021-01-14 MED ORDER — SENNOSIDES-DOCUSATE SODIUM 8.6-50 MG PO TABS
2.0000 | ORAL_TABLET | ORAL | Status: DC
  Administered 2021-01-14: 2 via ORAL
  Filled 2021-01-14: qty 2

## 2021-01-14 MED ORDER — ONDANSETRON HCL 4 MG/2ML IJ SOLN: 4.0000 mg | INTRAMUSCULAR | Status: DC | PRN

## 2021-01-14 NOTE — Discharge Summary (Signed)
Postpartum Discharge Summary      Patient Name: Sheri Walker DOB: 10/24/1995 MRN: [id number]  Date of admission: 01/12/2021 Delivery date:01/14/2021  Delivering provider: Janet Berlin  Date of discharge: 01/15/2021  Admitting diagnosis: Post term pregnancy at [redacted] weeks gestation [O48.0, Z3A.41] Intrauterine pregnancy: [redacted]w[redacted]d    Secondary diagnosis:  Active Problems:   Post term pregnancy at 498weeks gestation   Vaginal delivery  Additional problems: none    Discharge diagnosis: Term Pregnancy Delivered                                              Post partum procedures:none Augmentation: Pitocin Complications: None  Hospital course: Induction of Labor With Vaginal Delivery   25y.o. yo G2P0010 at 469w6das admitted to the hospital 01/12/2021 for induction of labor.  Indication for induction: PROM.  Patient had an uncomplicated labor course as follows: Membrane Rupture Time/Date: 8:30 AM ,01/12/2021   Delivery Method:Vaginal, Spontaneous  Episiotomy: None  Lacerations:  Labial  Details of delivery can be found in separate delivery note.  Patient had a routine postpartum course. Patient is discharged home 01/15/21.  Newborn Data: Birth date:01/14/2021  Birth time:1:42 AM  Gender:Female  Living status:Living  Apgars:7 ,9  Weight:3325 g   Magnesium Sulfate received: No BMZ received: No Rhophylac:N/A MMR:N/A T-DaP:Given prenatally Flu: Yes Transfusion:No  Physical exam  Vitals:   01/14/21 0930 01/14/21 1507 01/14/21 1906 01/15/21 0536  BP: 116/71 108/68 110/64 103/61  Pulse: (!) 105 95 92 76  Resp: 18 16 17    Temp: 98.1 F (36.7 C) 98 F (36.7 C)  97.6 F (36.4 C)  TempSrc: Oral Oral  Oral  SpO2: 100% 96% 99% 100%  Weight:      Height:       General: alert, cooperative and no distress Lochia: appropriate Uterine Fundus: firm Incision: N/A DVT Evaluation: No evidence of DVT seen on physical exam. Negative Homan's sign. No cords or calf  tenderness. Labs: Lab Results  Component Value Date   WBC 12.3 (H) 01/12/2021   HGB 13.9 01/12/2021   HCT 41.8 01/12/2021   MCV 99.8 01/12/2021   PLT 212 01/12/2021   CMP Latest Ref Rng & Units 01/12/2021  Glucose 70 - 99 mg/dL 136(H)  BUN 6 - 20 mg/dL 6  Creatinine 0.44 - 1.00 mg/dL 0.67  Sodium 135 - 145 mmol/L 134(L)  Potassium 3.5 - 5.1 mmol/L 3.4(L)  Chloride 98 - 111 mmol/L 105  CO2 22 - 32 mmol/L 17(L)  Calcium 8.9 - 10.3 mg/dL 9.0  Total Protein 6.5 - 8.1 g/dL 6.1(L)  Total Bilirubin 0.3 - 1.2 mg/dL 0.3  Alkaline Phos 38 - 126 U/L 103  AST 15 - 41 U/L 21  ALT 0 - 44 U/L 24   Edinburgh Score: No flowsheet data found.   After visit meds:  Allergies as of 01/15/2021      Reactions   Peanut-containing Drug Products Anaphylaxis   All Nuts       Medication List    STOP taking these medications   acetaminophen 325 MG tablet Commonly known as: TYLENOL   Blood Pressure Kit Devi   valACYclovir 500 MG tablet Commonly known as: Valtrex     TAKE these medications   albuterol 108 (90 Base) MCG/ACT inhaler Commonly known as: VENTOLIN HFA Inhale 2 puffs into the  lungs every 6 (six) hours as needed for wheezing or shortness of breath.   budesonide-formoterol 80-4.5 MCG/ACT inhaler Commonly known as: Symbicort Inhale 2 puffs into the lungs 2 (two) times daily.   ibuprofen 600 MG tablet Commonly known as: ADVIL Take 1 tablet (600 mg total) by mouth every 6 (six) hours.   multivitamin-prenatal 27-0.8 MG Tabs tablet Take 1 tablet by mouth daily at 12 noon.        Discharge home in stable condition Infant Feeding: Breast Infant Disposition:home with mother Discharge instruction: per After Visit Summary and Postpartum booklet. Activity: Advance as tolerated. Pelvic rest for 6 weeks.  Diet: routine diet Future Appointments: Future Appointments  Date Time Provider Pump Back  02/26/2021  1:35 PM Virginia Rochester, NP [facility]   Follow up Visit:   [place] [facility] Follow up on 02/26/2021.   Specialty: Obstetrics and Gynecology Why: for postpartum checkup Contact information: [place] [address] [phone]               Please schedule this patient for a In person postpartum visit in 6 weeks with the following provider: Any provider. Additional Postpartum F/U:IUD at pp visit  Low risk pregnancy complicated by: uncomplicated Delivery mode:  Vaginal, Spontaneous  Anticipated Birth Control:  IUD   01/15/2021 Christin Fudge, CNM

## 2021-01-14 NOTE — Lactation Note (Signed)
This note was copied from a baby's chart. Lactation Consultation Note  Patient Name: Sheri Walker [id number] Date: 01/14/2021 Reason for consult: L&D Initial assessment;Term;1st time breastfeeding Age:26 hours LC entered the room, infant was cuing to breastfeed. Mom latched infant on her right breast using the football hold, infant latched with depth, was still breastfeeding after 8 minutes when LC left the room. Mom knows to breastfeed infant according to primal cues: licking, kissing, tasting, rooting, hands in mouth, STS. Mom knows to call RN or LC on MBU if she needs further assistance with latching infant at the breast. LC discussed infant's input and output with parents.  Maternal Data Has patient been taught Hand Expression?: Yes Does the patient have breastfeeding experience prior to this delivery?: No  Feeding Mother's Current Feeding Choice: Breast Milk and Formula  LATCH Score Latch: Grasps breast easily, tongue down, lips flanged, rhythmical sucking.  Audible Swallowing: Spontaneous and intermittent  Type of Nipple: Inverted  Comfort (Breast/Nipple): Soft / non-tender  Hold (Positioning): Assistance needed to correctly position infant at breast and maintain latch.  LATCH Score: 7   Lactation Tools Discussed/Used    Interventions Interventions: Breast feeding basics reviewed;Assisted with latch;Skin to skin;Hand express;Adjust position;Support pillows;Position options;Expressed milk;Education  Discharge WIC Program: Yes  Consult Status Consult Status: Follow-up Date: 01/14/21 Follow-up type: In-patient    Danelle Earthly 01/14/2021, 3:11 AM

## 2021-01-15 ENCOUNTER — Inpatient Hospital Stay (HOSPITAL_COMMUNITY): Admission: AD | Admit: 2021-01-15 | Payer: 59 | Source: Home / Self Care | Admitting: Obstetrics & Gynecology

## 2021-01-15 ENCOUNTER — Inpatient Hospital Stay (HOSPITAL_COMMUNITY): Payer: 59

## 2021-01-15 MED ORDER — IBUPROFEN 600 MG PO TABS: 600.0000 mg | ORAL_TABLET | Freq: Four times a day (QID) | ORAL | 0 refills | Status: DC

## 2021-01-15 NOTE — Lactation Note (Addendum)
This note was copied from a baby's chart. Lactation Consultation Note  Patient Name: Sheri Walker [id number] Date: 01/15/2021 Reason for consult: Follow-up assessment;Nipple pain/trauma;1st time breastfeeding;Term;Hyperbilirubinemia Age:26 hours   LC Student entered the room; mom had finished eating and baby was with dad showing hunger cues. LC Student undressed baby and got them into the football hold on the left breast.  Breast anatomy was normal spaced breasts. Hand expression on left breast expressed easily, right breast no flow occurred.   Baby had slightly yellowed skin, LC Student noticed a tongue restriction as well as a heart shaped indention on the tongue.  Baby latched and fed at the breast, but didn't have continuous, audible swallowing; baby was noticeably sleepy. No nipple compression or compression stripes present during feedings.That initial latch was approximately 20 minutes, but then due to baby's jaundice levels, discussion of supplementation began.   Mom's preference was donor breast milk, and LC was able to offer support in getting things set up with a 5 french and a syringe. Baby latched and fed at both breasts, 1st the right breast for 8 min, followed by a burp, and 2nd at the left breast for 7 min. Baby became more alert through feedings and was sleepy and put STS at the end.  Instructed parents on how to observe baby's feeding with a 5 [place], cleaning 5 [place] and syringe, as well as storage instructions for DBM. Encouraged family to call if additional assistance was needed during feedings.   Maternal Data    Feeding Mother's Current Feeding Choice: Breast Milk and Donor Milk  LATCH Score Latch: Repeated attempts needed to sustain latch, nipple held in mouth throughout feeding, stimulation needed to elicit sucking reflex.  Audible Swallowing: A few with stimulation  Type of Nipple: Inverted  Comfort (Breast/Nipple): Soft / non-tender  Hold  (Positioning): Assistance needed to correctly position infant at breast and maintain latch.  LATCH Score: 5   Lactation Tools Discussed/Used Tools: 71F feeding tube / Syringe  Interventions Interventions: Breast feeding basics reviewed;Assisted with latch;Skin to skin;Breast massage;Hand express;Breast compression;Adjust position;Support pillows;Expressed milk  Discharge Pump: Manual  Consult Status Consult Status: Follow-up Date: 01/16/21 Follow-up type: In-patient    Sheri Walker 01/15/2021, 11:23 AM

## 2021-01-15 NOTE — Lactation Note (Signed)
This note was copied from a baby's chart. Lactation Consultation Note  Patient Name: Sheri Walker [id number] Date: 01/15/2021   Age:26 hours  RN asked to put in order for dyad to be seen by Lactation on postpartum floor for infant indication of hyperbilirubinemia.   Lurline Hare [facility] 01/15/2021, 8:11 AM

## 2021-01-15 NOTE — Lactation Note (Signed)
This note was copied from a baby's chart. Lactation Consultation Note  Patient Name: Girl Moira Umholtz [id number] Date: 01/15/2021   Age:26 hours  LC Student entered the room to talk to parents about lactation.Baby was in the bassinet. Both parents declined at this time and said they'll call lactation when they are ready for a visit;   Maternal Data    Feeding    LATCH Score                    Lactation Tools Discussed/Used    Interventions    Discharge    Consult Status      Deztiny Sarra 01/15/2021, 8:45 AM

## 2021-01-16 ENCOUNTER — Telehealth: Payer: Self-pay | Admitting: *Deleted

## 2021-01-16 ENCOUNTER — Ambulatory Visit: Payer: Self-pay

## 2021-01-16 NOTE — Lactation Note (Signed)
This note was copied from a baby's chart. Lactation Consultation Note  Patient Name: Sheri Walker [id number] Date: 01/16/2021 Reason for consult: Term;Primapara;1st time breastfeeding Age:26 hours   P1 mother whose infant is now 57 hours old.  This is a term baby at 40+6 weeks. Mother is breast feeding and supplementing with donor breast milk.  Mother was preparing to give donor milk when I arrived.  Offered to assist with latching and mother agreeable.  She stated her right nipple is sore.  Upon observation, her breasts are soft and non tender and nipples are intact.  No trauma noted.  Assisted to latch easily in the football hold on the right breast.  Completed a lot of basic breast feeding education while observing baby feed.  Demonstrated breast compressions and gentle stimulation.  Reminded mother to always feed STS until baby is latching and feeding well.  Mother observed a good latch and was pleased; no discomfort noted.  Reviewed plan of care for after discharge.  Encouraged parents to feed the rest of the donor breast milk since it cannot be taken home with them.  Parents are using a syringe for supplementation. Engorgement prevention/treatment reviewed.  Mother has a manual pump at bedside.  Mother has our OP phone number for any concerns after discharge. Father supportive and willing to assist.     Maternal Data    Feeding Mother's Current Feeding Choice: Breast Milk and Donor Milk  LATCH Score Latch: Grasps breast easily, tongue down, lips flanged, rhythmical sucking.  Audible Swallowing: Spontaneous and intermittent  Type of Nipple: Everted at rest and after stimulation  Comfort (Breast/Nipple): Soft / non-tender  Hold (Positioning): Assistance needed to correctly position infant at breast and maintain latch.  LATCH Score: 9   Lactation Tools Discussed/Used Tools: 75F feeding tube / Syringe  Interventions Interventions: Breast feeding basics reviewed;Assisted  with latch;Skin to skin;Breast massage;Hand express;Breast compression;Adjust position;Hand pump;Comfort gels;Coconut oil;Position options;Support pillows;Education  Discharge Discharge Education: Engorgement and breast care Pump: Manual  Consult Status Consult Status: Complete Date: 01/16/21 Follow-up type: Call as needed    Irene Pap De Libman 01/16/2021, 12:26 PM

## 2021-01-16 NOTE — Telephone Encounter (Signed)
Transition Care Management Unsuccessful Follow-up Telephone Call  Date of discharge and from where:  01/15/2021 - [facility]  Attempts:  1st Attempt  Reason for unsuccessful TCM follow-up call:  Left voice message

## 2021-01-17 NOTE — Telephone Encounter (Signed)
Transition Care Management Follow-up Telephone Call  Date of discharge and from where: 01/15/2021 - [facility]  How have you been since you were released from the hospital? "Fine"  Any questions or concerns? No  Items Reviewed:  Did the pt receive and understand the discharge instructions provided? Yes   Medications obtained and verified? Yes   Other? No   Any new allergies since your discharge? No   Dietary orders reviewed? No  Do you have support at home? Yes    Functional Questionnaire: (I = Independent and D = Dependent) ADLs: I  Bathing/Dressing- I  Meal Prep- I  Eating- I  Maintaining continence- I  Transferring/Ambulation- I  Managing Meds- I  Follow up appointments reviewed:   PCP Hospital f/u appt confirmed? No    Specialist Hospital f/u appt confirmed? Yes  Scheduled to see OBGYN on 02/26/2021 @ 1335.  Are transportation arrangements needed? No   If their condition worsens, is the pt aware to call PCP or go to the Emergency Dept.? Yes  Was the patient provided with contact information for the PCP's office or ED? Yes  Was to pt encouraged to call back with questions or concerns? Yes

## 2021-01-21 ENCOUNTER — Other Ambulatory Visit: Payer: Self-pay | Admitting: Advanced Practice Midwife

## 2021-01-26 ENCOUNTER — Other Ambulatory Visit: Payer: Self-pay | Admitting: Advanced Practice Midwife

## 2021-01-29 ENCOUNTER — Other Ambulatory Visit: Payer: Self-pay

## 2021-01-30 ENCOUNTER — Other Ambulatory Visit: Payer: Self-pay | Admitting: Advanced Practice Midwife

## 2021-02-04 ENCOUNTER — Other Ambulatory Visit: Payer: Self-pay | Admitting: Advanced Practice Midwife

## 2021-02-08 ENCOUNTER — Other Ambulatory Visit: Payer: Self-pay | Admitting: Advanced Practice Midwife

## 2021-02-12 ENCOUNTER — Other Ambulatory Visit: Payer: Self-pay

## 2021-02-26 ENCOUNTER — Other Ambulatory Visit: Payer: Self-pay

## 2021-02-26 ENCOUNTER — Encounter: Payer: Self-pay | Admitting: Nurse Practitioner

## 2021-02-26 ENCOUNTER — Other Ambulatory Visit (HOSPITAL_COMMUNITY)
Admission: RE | Admit: 2021-02-26 | Discharge: 2021-02-26 | Disposition: A | Discharge status: Home / Self Care | Payer: 59 | Source: Ambulatory Visit | Attending: Nurse Practitioner | Admitting: Nurse Practitioner

## 2021-02-26 ENCOUNTER — Ambulatory Visit (INDEPENDENT_AMBULATORY_CARE_PROVIDER_SITE_OTHER): Payer: 59 | Admitting: Nurse Practitioner

## 2021-02-26 DIAGNOSIS — N898 Other specified noninflammatory disorders of vagina: Secondary | ICD-10-CM

## 2021-02-26 NOTE — Progress Notes (Signed)
Post Partum Visit Note  Sheri Walker is a 25 y.o. G51P1011 female who presents for a postpartum visit. She is 6 weeks postpartum following a normal spontaneous vaginal delivery.  I have fully reviewed the prenatal and intrapartum course. The delivery was at [redacted]w[redacted]d gestational weeks.  Anesthesia: none. Postpartum course has been good. Baby is doing well. Baby is feeding by both breast and bottle - Enfamil . Patient is trying to increase her milk supply and depend less on formula.  Bleeding no bleeding. initial bleeding stopped and then she began bleeding again after 2 weeks of no bleeding.  Bowel function is normal. Bladder function is normal. Patient is not sexually active. Contraception method is none. Postpartum depression screening: negative.   The pregnancy intention screening data noted above was reviewed. Potential methods of contraception were discussed. The patient elected to proceed with IUD or IUS.  Reviewed menstrual history - menses 7 days with heavy bleeding and mild cramping.  Initially wanted nonhormonall IUD as she did not like hormonal oral contraceptives, but given her menstrual history of heavy bleeding, discussed hormonal IUD that is designed for local, not systemic, hormonal control and client agrees it would be the best choice for her.   Edinburgh Postnatal Depression Scale - 02/26/21 1347      Edinburgh Postnatal Depression Scale:  In the Past 7 Days   I have been able to laugh and see the funny side of things. 0    I have looked forward with enjoyment to things. 0    I have blamed myself unnecessarily when things went wrong. 1    I have been anxious or worried for no good reason. 0    I have felt scared or panicky for no good reason. 0    Things have been getting on top of me. 0    I have been so unhappy that I have had difficulty sleeping. 0    I have felt sad or miserable. 1    I have been so unhappy that I have been crying. 1    The thought of harming myself  has occurred to me. 0    Edinburgh Postnatal Depression Scale Total 3           Health Maintenance Due  Topic Date Due  . COVID-19 Vaccine (3 - Booster for Pfizer series) 07/09/2020    The following portions of the patient's history were reviewed and updated as appropriate: allergies, current medications, past family history, past medical history, past social history, past surgical history and problem list.  Review of Systems Pertinent items noted in HPI and remainder of comprehensive ROS otherwise negative.  Objective:  BP 118/85   Pulse 87   Wt 154 lb 8 oz (70.1 kg)   Breastfeeding Yes   BMI 27.37 kg/m    General:  alert, cooperative and no distress   Breasts:  not indicated  Lungs: clear to auscultation bilaterally  Heart:  regular rate and rhythm, S1, S2 normal, no murmur, click, rub or gallop  Abdomen: soft, nontender   Wound  no wounds identified today  GU exam:  inspection of introitus - diffuse redness in mucus membrane tissue below the clitoris, Some mild redness at 5 and 7 o'clock at introitus.  Palpation of those areas was painful for client.  Will not do speculum exam today, but vaginal swab done to rule out infection.       Assessment:     Normal postpartum exam.  Vaginal  irritation and pain.  Plan:   Essential components of care per ACOG recommendations:  1.  Mood and well being: Patient with negative depression screening today. Reviewed local resources for support.  - Patient tobacco use? No.   - hx of drug use? No.    2. Infant care and feeding:  -Patient currently breastmilk feeding? Yes. Reviewed importance of draining breast regularly to support lactation.  -Social determinants of health (SDOH) reviewed in EPIC. No concerns  3. Sexuality, contraception and birth spacing - Patient does not want a pregnancy in the next year.  Desired family size is unknown number of children.  - Reviewed forms of contraception in tiered fashion. Patient desired  IUD - liletta.  Will return in 2 weeks for insertion as having vaginal irritation today.  Will test for any infection and treat or give more time for vaginal healing before IUD inserted. - Discussed birth spacing of 18 months  4. Sleep and fatigue -Encouraged family/partner/community support of 4 hrs of uninterrupted sleep to help with mood and fatigue  5. Physical Recovery  - Discussed patients delivery and complications. She describes her labor as good.but long.  Pushing was 3 hours. - Patient had a Vaginal problems after delivery including labial laceration. Patient had a labial laceration. Perineal healing reviewed. Patient expressed understanding - Patient has urinary incontinence? Yes. Discussed role of pelvic floor PT. as occasional occurrence.  To notify the office if incontinence does not resolve with pelvic floor exercises. - Patient is not safe to resume physical and sexual activity due to vaginal irritation and does not yet have the IUD.  6.  Health Maintenance - HM due items addressed Yes - Last pap smear  Diagnosis  Date Value Ref Range Status  06/25/2020   Final   - Negative for intraepithelial lesion or malignancy (NILM)   Pap smear not done at today's visit.  -Breast Cancer screening indicated? No.   7. Chronic Disease/Pregnancy Condition follow up: None  - PCP follow up  Currie Paris, NP [facility] [employer]

## 2021-02-26 NOTE — Patient Instructions (Signed)

## 2021-02-27 ENCOUNTER — Other Ambulatory Visit: Payer: Self-pay | Admitting: Family Medicine

## 2021-02-27 LAB — CERVICOVAGINAL ANCILLARY ONLY
Bacterial Vaginitis (gardnerella): NEGATIVE
Candida Glabrata: NEGATIVE
Candida Vaginitis: NEGATIVE
Chlamydia: NEGATIVE
Comment: NEGATIVE
Comment: NEGATIVE
Comment: NEGATIVE
Comment: NEGATIVE
Comment: NEGATIVE
Comment: NORMAL
Neisseria Gonorrhea: NEGATIVE
Trichomonas: NEGATIVE

## 2021-03-13 ENCOUNTER — Encounter: Payer: Self-pay | Admitting: Family Medicine

## 2021-03-13 ENCOUNTER — Ambulatory Visit (INDEPENDENT_AMBULATORY_CARE_PROVIDER_SITE_OTHER): Payer: 59 | Admitting: Family Medicine

## 2021-03-13 ENCOUNTER — Other Ambulatory Visit: Payer: Self-pay

## 2021-03-13 VITALS — BP 134/90 | HR 85 | Wt 151.0 lb

## 2021-03-13 DIAGNOSIS — Z30430 Encounter for insertion of intrauterine contraceptive device: Secondary | ICD-10-CM | POA: Diagnosis not present

## 2021-03-13 DIAGNOSIS — Z3202 Encounter for pregnancy test, result negative: Secondary | ICD-10-CM | POA: Diagnosis not present

## 2021-03-13 LAB — POCT PREGNANCY, URINE: Preg Test, Ur: NEGATIVE

## 2021-03-13 MED ORDER — LEVONORGESTREL 20.1 MCG/DAY IU IUD
1.0000 | INTRAUTERINE_SYSTEM | Freq: Once | INTRAUTERINE | Status: AC
  Administered 2021-03-13: 1 via INTRAUTERINE

## 2021-03-13 NOTE — Addendum Note (Signed)
Addended byVidal Schwalbe on: 03/13/2021 03:17 PM   Modules accepted: Orders

## 2021-03-13 NOTE — Progress Notes (Signed)
IUD Procedure Note Patient identified, informed consent performed, signed copy in chart, time out was performed.  Urine pregnancy test negative.  Speculum placed in the vagina.  Cervix visualized.  Cleaned with Betadine x 2.  Paracervical block placed with Lidocaine 2% with epinephrine 28mL spread between the 12 o'clock. Cervix grasped anteriorly with a single tooth tenaculum.  Uterus sounded to 8 cm.  Liletta  IUD placed per manufacturer's recommendations.  Strings trimmed to 3 cm. Tenaculum was removed, good hemostasis noted.  Patient tolerated procedure well.   Patient given post procedure instructions and Liletta care card with expiration date.  Patient is asked to check IUD strings periodically and follow up in 4-6 weeks for IUD check.

## 2021-04-03 ENCOUNTER — Telehealth: Payer: Self-pay | Admitting: Lactation Services

## 2021-04-03 NOTE — Telephone Encounter (Signed)
Received request for Ibuprofen 600 mg refills from Pharmacy. Called patient to inquire for need for Ibuprofen refill. Patient did not answer. LM for her to call the office at her convenience to discuss. My Chart message sent.

## 2021-04-10 ENCOUNTER — Other Ambulatory Visit: Payer: Self-pay

## 2021-04-10 ENCOUNTER — Ambulatory Visit (INDEPENDENT_AMBULATORY_CARE_PROVIDER_SITE_OTHER): Payer: 59 | Admitting: Family Medicine

## 2021-04-10 ENCOUNTER — Encounter: Payer: Self-pay | Admitting: Family Medicine

## 2021-04-10 VITALS — BP 128/76 | Ht 63.0 in | Wt 148.7 lb

## 2021-04-10 DIAGNOSIS — Z30431 Encounter for routine checking of intrauterine contraceptive device: Secondary | ICD-10-CM | POA: Diagnosis not present

## 2021-04-10 DIAGNOSIS — N949 Unspecified condition associated with female genital organs and menstrual cycle: Secondary | ICD-10-CM | POA: Diagnosis not present

## 2021-04-10 DIAGNOSIS — R102 Pelvic and perineal pain: Secondary | ICD-10-CM

## 2021-04-10 MED ORDER — LIDOCAINE (ANORECTAL) 5 % EX GEL: 1.0000 "application " | Freq: Every day | CUTANEOUS | 3 refills | Status: AC | PRN

## 2021-04-10 MED ORDER — MEDROXYPROGESTERONE ACETATE 10 MG PO TABS: 10.0000 mg | ORAL_TABLET | Freq: Every day | ORAL | 1 refills | Status: DC

## 2021-04-10 NOTE — Progress Notes (Signed)
   Subjective:   Patient Name: Sheri Walker, female   DOB: 07-01-1996, 25 y.o.  MRN: [id number]  HPI Patient here for an IUD check.  She had the Liletta IUD placed 1 month ago.  She reports light bleeding - changing panti liner every few hours. Did have pain with sex. Hasn't try to have sex again. Pain is located in perineum.   Review of Systems  Constitutional: Negative for fever and chills.  Gastrointestinal: Negative for abdominal pain.  Genitourinary: Negative for vaginal discharge, vaginal pain, pelvic pain and dyspareunia.        Objective:   Physical Exam  Constitutional: She appears well-developed and well-nourished.  HENT:  Head: Normocephalic and atraumatic.  Abdominal: Soft. There is no tenderness. There is no guarding.  Genitourinary: There is no rash, tenderness or lesion on the right labia. There is no rash, tenderness or lesion on the left labia. Tenderness on perineum and at introitus. No foreign body around the vagina. No signs of injury around the vagina. No vaginal discharge found.    Skin: Skin is warm and dry.  Psychiatric: She has a normal mood and affect. Her behavior is normal. Judgment and thought content normal.       Assessment & Plan:  1. IUD check up IUD in place.  Add provera 10mg  daily. Pt to call with any other problems.    2. Perineal pain in female Perineum well healed. Lidocaine gel prescribed. Discussed ways to improve pain during sex - lubrication, lidocaine gel, being on top to control angle, depth, speed, etc. F/u in 2 months.

## 2021-06-05 ENCOUNTER — Ambulatory Visit: Payer: 59

## 2021-07-10 ENCOUNTER — Other Ambulatory Visit: Payer: Self-pay

## 2021-07-10 ENCOUNTER — Ambulatory Visit (INDEPENDENT_AMBULATORY_CARE_PROVIDER_SITE_OTHER): Payer: 59 | Admitting: Family Medicine

## 2021-07-10 VITALS — BP 123/77 | HR 88 | Ht 63.0 in | Wt 133.7 lb

## 2021-07-10 DIAGNOSIS — Z23 Encounter for immunization: Secondary | ICD-10-CM | POA: Diagnosis not present

## 2021-07-10 DIAGNOSIS — N949 Unspecified condition associated with female genital organs and menstrual cycle: Secondary | ICD-10-CM

## 2021-07-10 DIAGNOSIS — R102 Pelvic and perineal pain: Secondary | ICD-10-CM

## 2021-07-10 NOTE — Progress Notes (Signed)
   Subjective:    Patient ID: Sheri Walker is a 25 y.o. female presenting with Follow-up  on 07/10/2021  HPI: Patient here for f/u after perineal laceration that was causing pain. She reports normal healing now and no further pain. She has no issue with her IUD. Her mood is good.  Review of Systems  Constitutional:  Negative for chills and fever.  Respiratory:  Negative for shortness of breath.   Cardiovascular:  Negative for chest pain.  Gastrointestinal:  Negative for abdominal pain, nausea and vomiting.  Genitourinary:  Negative for dysuria.  Skin:  Negative for rash.     Objective:    BP 123/77   Pulse 88   Ht 5\' 3"  (1.6 m)   Wt 133 lb 11.2 oz (60.6 kg)   LMP 06/30/2021   Breastfeeding Yes   BMI 23.68 kg/m  Physical Exam Constitutional:      General: She is not in acute distress.    Appearance: She is well-developed.  HENT:     Head: Normocephalic and atraumatic.  Eyes:     General: No scleral icterus. Cardiovascular:     Rate and Rhythm: Normal rate.  Pulmonary:     Effort: Pulmonary effort is normal.  Abdominal:     Palpations: Abdomen is soft.  Musculoskeletal:     Cervical back: Neck supple.  Skin:    General: Skin is warm and dry.  Neurological:     Mental Status: She is alert and oriented to person, place, and time.        Assessment & Plan:  Need for immunization against influenza - Plan: Flu Vaccine QUAD 48mo+IM (Fluarix, Fluzone & Alfiuria Quad PF)  Perineal pain in female - Improved, no need for further f/u.   Return if symptoms worsen or fail to improve.  5mo 07/10/2021 2:41 PM

## 2022-06-05 ENCOUNTER — Emergency Department (HOSPITAL_COMMUNITY): Payer: Medicaid Other

## 2022-06-05 ENCOUNTER — Other Ambulatory Visit: Payer: Self-pay

## 2022-06-05 ENCOUNTER — Inpatient Hospital Stay (HOSPITAL_COMMUNITY)
Admission: EM | Admit: 2022-06-05 | Discharge: 2022-06-07 | DRG: 202 | Disposition: A | Discharge status: Home / Self Care | Payer: Medicaid Other | Attending: Internal Medicine | Admitting: Internal Medicine

## 2022-06-05 ENCOUNTER — Encounter (HOSPITAL_COMMUNITY): Payer: Self-pay

## 2022-06-05 DIAGNOSIS — R0602 Shortness of breath: Secondary | ICD-10-CM | POA: Diagnosis not present

## 2022-06-05 DIAGNOSIS — B9710 Unspecified enterovirus as the cause of diseases classified elsewhere: Secondary | ICD-10-CM | POA: Diagnosis present

## 2022-06-05 DIAGNOSIS — Z79899 Other long term (current) drug therapy: Secondary | ICD-10-CM

## 2022-06-05 DIAGNOSIS — E876 Hypokalemia: Secondary | ICD-10-CM | POA: Diagnosis present

## 2022-06-05 DIAGNOSIS — A6009 Herpesviral infection of other urogenital tract: Secondary | ICD-10-CM | POA: Diagnosis present

## 2022-06-05 DIAGNOSIS — Z83438 Family history of other disorder of lipoprotein metabolism and other lipidemia: Secondary | ICD-10-CM

## 2022-06-05 DIAGNOSIS — Z20822 Contact with and (suspected) exposure to covid-19: Secondary | ICD-10-CM | POA: Diagnosis present

## 2022-06-05 DIAGNOSIS — D751 Secondary polycythemia: Secondary | ICD-10-CM | POA: Diagnosis present

## 2022-06-05 DIAGNOSIS — J45901 Unspecified asthma with (acute) exacerbation: Principal | ICD-10-CM | POA: Diagnosis present

## 2022-06-05 DIAGNOSIS — B9789 Other viral agents as the cause of diseases classified elsewhere: Secondary | ICD-10-CM | POA: Diagnosis present

## 2022-06-05 DIAGNOSIS — E872 Acidosis, unspecified: Secondary | ICD-10-CM | POA: Diagnosis present

## 2022-06-05 DIAGNOSIS — R000 Tachycardia, unspecified: Secondary | ICD-10-CM | POA: Diagnosis present

## 2022-06-05 DIAGNOSIS — E860 Dehydration: Secondary | ICD-10-CM | POA: Diagnosis present

## 2022-06-05 DIAGNOSIS — Z7951 Long term (current) use of inhaled steroids: Secondary | ICD-10-CM

## 2022-06-05 DIAGNOSIS — J45909 Unspecified asthma, uncomplicated: Secondary | ICD-10-CM | POA: Diagnosis present

## 2022-06-05 DIAGNOSIS — Z23 Encounter for immunization: Secondary | ICD-10-CM

## 2022-06-05 LAB — CBC WITH DIFFERENTIAL/PLATELET
Abs Immature Granulocytes: 0.05 10*3/uL (ref 0.00–0.07)
Basophils Absolute: 0 10*3/uL (ref 0.0–0.1)
Basophils Relative: 0 %
Eosinophils Absolute: 0.4 10*3/uL (ref 0.0–0.5)
Eosinophils Relative: 2 %
HCT: 48.1 % — ABNORMAL HIGH (ref 36.0–46.0)
Hemoglobin: 16.3 g/dL — ABNORMAL HIGH (ref 12.0–15.0)
Immature Granulocytes: 0 %
Lymphocytes Relative: 4 %
Lymphs Abs: 0.8 10*3/uL (ref 0.7–4.0)
MCH: 32.1 pg (ref 26.0–34.0)
MCHC: 33.9 g/dL (ref 30.0–36.0)
MCV: 94.9 fL (ref 80.0–100.0)
Monocytes Absolute: 0.7 10*3/uL (ref 0.1–1.0)
Monocytes Relative: 4 %
Neutro Abs: 15.2 10*3/uL — ABNORMAL HIGH (ref 1.7–7.7)
Neutrophils Relative %: 90 %
Platelets: 287 10*3/uL (ref 150–400)
RBC: 5.07 MIL/uL (ref 3.87–5.11)
RDW: 11.5 % (ref 11.5–15.5)
WBC: 17.2 10*3/uL — ABNORMAL HIGH (ref 4.0–10.5)
nRBC: 0 % (ref 0.0–0.2)

## 2022-06-05 LAB — BASIC METABOLIC PANEL
Anion gap: 7 (ref 5–15)
BUN: 13 mg/dL (ref 6–20)
CO2: 23 mmol/L (ref 22–32)
Calcium: 8.8 mg/dL — ABNORMAL LOW (ref 8.9–10.3)
Chloride: 108 mmol/L (ref 98–111)
Creatinine, Ser: 0.82 mg/dL (ref 0.44–1.00)
GFR, Estimated: >60 mL/min (ref >60)
Glucose, Bld: 104 mg/dL — ABNORMAL HIGH (ref 70–99)
Potassium: 3.4 mmol/L — ABNORMAL LOW (ref 3.5–5.1)
Sodium: 138 mmol/L (ref 135–145)

## 2022-06-05 LAB — SARS CORONAVIRUS 2 BY RT PCR: SARS Coronavirus 2 by RT PCR: NEGATIVE

## 2022-06-05 MED ORDER — MAGNESIUM SULFATE 2 GM/50ML IV SOLN
2.0000 g | Freq: Once | INTRAVENOUS | Status: AC
  Administered 2022-06-05: 2 g via INTRAVENOUS
  Filled 2022-06-05: qty 50

## 2022-06-05 MED ORDER — ALBUTEROL SULFATE (2.5 MG/3ML) 0.083% IN NEBU
5.0000 mg | INHALATION_SOLUTION | Freq: Once | RESPIRATORY_TRACT | Status: AC
  Administered 2022-06-05: 5 mg via RESPIRATORY_TRACT
  Filled 2022-06-05: qty 6

## 2022-06-05 MED ORDER — LACTATED RINGERS IV BOLUS
1000.0000 mL | Freq: Once | INTRAVENOUS | Status: AC
  Administered 2022-06-05: 1000 mL via INTRAVENOUS

## 2022-06-05 MED ORDER — IPRATROPIUM BROMIDE 0.02 % IN SOLN
0.5000 mg | Freq: Once | RESPIRATORY_TRACT | Status: AC
  Administered 2022-06-05: 0.5 mg via RESPIRATORY_TRACT
  Filled 2022-06-05: qty 2.5

## 2022-06-05 MED ORDER — LACTATED RINGERS IV SOLN: INTRAVENOUS | Status: DC

## 2022-06-05 MED ORDER — METHYLPREDNISOLONE SODIUM SUCC 125 MG IJ SOLR
125.0000 mg | INTRAMUSCULAR | Status: AC
  Administered 2022-06-05: 125 mg via INTRAVENOUS
  Filled 2022-06-05: qty 2

## 2022-06-05 NOTE — ED Provider Notes (Signed)
[facility]-EMERGENCY DEPT Provider Note   CSN: [id number] Arrival date & time: 06/05/22  2109     History  Chief Complaint  Patient presents with   Asthma    Sheri Walker is a 26 y.o. female.  26 year old female with history of asthma presents with several days shortness of breath.  Notes breath of cough.  Without fever or chills.  Has been using Primatene Mist over-the-counter.  Does feel somewhat tremulous.  Has been hospitalized in the past before for asthma.  History is limited due to her acute state       Home Medications Prior to Admission medications   Medication Sig Start Date End Date Taking? Authorizing Provider  albuterol (VENTOLIN HFA) 108 (90 Base) MCG/ACT inhaler Inhale 2 puffs into the lungs every 6 (six) hours as needed for wheezing or shortness of breath. 09/17/20   Malachy Chamber, MD  budesonide-formoterol (SYMBICORT) 80-4.5 MCG/ACT inhaler Inhale 2 puffs into the lungs 2 (two) times daily. 09/17/20   Malachy Chamber, MD  ibuprofen (ADVIL) 600 MG tablet TAKE 1 TABLET(600 MG) BY MOUTH EVERY 6 HOURS Patient not taking: Reported on 07/10/2021 02/12/21   Venora Maples, MD  Lidocaine, Anorectal, 5 % GEL Apply 1 application topically daily as needed. Patient not taking: Reported on 07/10/2021 04/10/21   Levie Heritage, DO  medroxyPROGESTERone (PROVERA) 10 MG tablet Take 1 tablet (10 mg total) by mouth daily. Patient not taking: Reported on 07/10/2021 04/10/21   Levie Heritage, DO  Prenatal Vit-Fe Fumarate-FA (MULTIVITAMIN-PRENATAL) 27-0.8 MG TABS tablet Take 1 tablet by mouth daily at 12 noon.    [provider]      Allergies    Peanut-containing drug products    Review of Systems   Review of Systems  Unable to perform ROS: Acuity of condition    Physical Exam Updated Vital Signs BP (!) 170/143 (BP Location: Left Arm)   Pulse (!) 135   Temp 98.9 F (37.2 C) (Oral)   Resp (!) 26   Ht 1.6 m (5\' 3" )   Wt 56.7 kg    SpO2 90%   BMI 22.14 kg/m  Physical Exam Vitals and nursing note reviewed.  Constitutional:      General: She is not in acute distress.    Appearance: Normal appearance. She is well-developed. She is not toxic-appearing.  HENT:     Head: Normocephalic and atraumatic.  Eyes:     General: Lids are normal.     Conjunctiva/sclera: Conjunctivae normal.     Pupils: Pupils are equal, round, and reactive to light.  Neck:     Thyroid: No thyroid mass.     Trachea: No tracheal deviation.  Cardiovascular:     Rate and Rhythm: Regular rhythm. Tachycardia present.     Heart sounds: Normal heart sounds. No murmur heard.    No gallop.  Pulmonary:     Effort: Pulmonary effort is normal. No respiratory distress.     Breath sounds: No stridor. Examination of the right-upper field reveals decreased breath sounds and wheezing. Examination of the left-upper field reveals decreased breath sounds and wheezing. Decreased breath sounds and wheezing present. No rhonchi or rales.  Abdominal:     General: There is no distension.     Palpations: Abdomen is soft.     Tenderness: There is no abdominal tenderness. There is no rebound.  Musculoskeletal:        General: No tenderness. Normal range of motion.  Cervical back: Normal range of motion and neck supple.  Skin:    General: Skin is warm and dry.     Findings: No abrasion or rash.  Neurological:     Mental Status: She is alert and oriented to person, place, and time. Mental status is at baseline.     GCS: GCS eye subscore is 4. GCS verbal subscore is 5. GCS motor subscore is 6.     Cranial Nerves: No cranial nerve deficit.     Sensory: No sensory deficit.     Motor: Motor function is intact.  Psychiatric:        Attention and Perception: Attention normal.        Speech: Speech normal.        Behavior: Behavior normal.     ED Results / Procedures / Treatments   Labs (all labs ordered are listed, but only abnormal results are  displayed) Labs Reviewed  SARS CORONAVIRUS 2 BY RT PCR  CBC WITH DIFFERENTIAL/PLATELET  BASIC METABOLIC PANEL    EKG None  Radiology No results found.  Procedures Procedures    Medications Ordered in ED Medications  albuterol (PROVENTIL) (2.5 MG/3ML) 0.083% nebulizer solution 5 mg (has no administration in time range)  ipratropium (ATROVENT) nebulizer solution 0.5 mg (has no administration in time range)  methylPREDNISolone sodium succinate (SOLU-MEDROL) 125 mg/2 mL injection 125 mg (has no administration in time range)  magnesium sulfate IVPB 2 g 50 mL (has no administration in time range)  lactated ringers bolus 1,000 mL (has no administration in time range)  lactated ringers infusion (has no administration in time range)    ED Course/ Medical Decision Making/ A&P                           Medical Decision Making Amount and/or Complexity of Data Reviewed Labs: ordered. Radiology: ordered.  Risk Prescription drug management.   Patient's chest x-ray per interpretation shows no acute findings.  Patient's monitor strip shows sinus tachycardia.  She is COVID-negative.  Was treated here with Solu-Medrol, albuterol, magnesium.  Her breathing is greatly improved.  Patient will require admission.  Plan discussed with significant other at bedside  CRITICAL CARE Performed by: Toy Baker Total critical care time: 40 minutes Critical care time was exclusive of separately billable procedures and treating other patients. Critical care was necessary to treat or prevent imminent or life-threatening deterioration. Critical care was time spent personally by me on the following activities: development of treatment plan with patient and/or surrogate as well as nursing, discussions with consultants, evaluation of patient's response to treatment, examination of patient, obtaining history from patient or surrogate, ordering and performing treatments and interventions, ordering and review  of laboratory studies, ordering and review of radiographic studies, pulse oximetry and re-evaluation of patient's condition.         Final Clinical Impression(s) / ED Diagnoses Final diagnoses:  None    Rx / DC Orders ED Discharge Orders     None         Lorre Nick, MD 06/05/22 2247

## 2022-06-05 NOTE — ED Notes (Signed)
Pt statting at 88%. Pt placed on 3L.

## 2022-06-05 NOTE — H&P (Signed)
History and Physical  ARIADNA SETTER HAL:[id number] DOB: 19-Aug-1996 DOA: 06/05/2022  Referring physician: Dr. Freida Busman, EDP  PCP: Patient, No Pcp Per  Outpatient Specialists: OB/GYN. Patient coming from: Home.  Chief Complaint: Shortness of breath.  HPI: Sheri Walker is a 26 y.o. female with medical history significant for asthma, who presents to [facility] ED due to gradually worsening shortness of breath of 3 days duration.  Endorses exposure to sick contacts in her household.  Self medicated with over-the-counter Primatene Mist without improvement of her symptoms.  Presented to the ED for further evaluation and management.  No subjective fevers or chills.  Work-up in the ED revealed acute asthma exacerbation.  COVID-19 screening test negative.  Chest x-ray nonacute.  She received IV magnesium, IV Solu-Medrol and bronchodilators with some improvement of her symptomatology, however still dyspneic and tachycardic.  TRH, hospitalist service, was asked to admit.  ED Course: Tmax 98.9.  BP 118/82, pulse 115, respiratory rate 20, O2 saturation 94% on room air.  Lab studies significant for WBC 17.2, hemoglobin 16.3.  Potassium 3.4, serum glucose 104.  Review of Systems: Review of systems as noted in the HPI. All other systems reviewed and are negative.   Past Medical History:  Diagnosis Date   Acne    Allergy    Asthma    Asthma    [employer] 01/02/2021   History of pyelonephritis during pregnancy    HSV-2 (herpes simplex virus 2) infection 2016   Past Surgical History:  Procedure Laterality Date   NO PAST SURGERIES      Social History:  reports that she has never smoked. She has never used smokeless tobacco. She reports current alcohol use. She reports that she does not currently use drugs after having used the following drugs: Marijuana.   Allergies  Allergen Reactions   Peanut-Containing Drug Products Anaphylaxis    All Nuts     Family History  Problem Relation Age of Onset    Allergies Mother    Hyperlipidemia Father       Prior to Admission medications   Medication Sig Start Date End Date Taking? Authorizing Provider  EPINEPHrine (PRIMATENE MIST IN) Inhale 1 puff into the lungs as needed (shortness of breath).   Yes [provider]  albuterol (VENTOLIN HFA) 108 (90 Base) MCG/ACT inhaler Inhale 2 puffs into the lungs every 6 (six) hours as needed for wheezing or shortness of breath. Patient not taking: Reported on 06/05/2022 09/17/20   Malachy Chamber, MD  budesonide-formoterol [facility]) 80-4.5 MCG/ACT inhaler Inhale 2 puffs into the lungs 2 (two) times daily. Patient not taking: Reported on 06/05/2022 09/17/20   Malachy Chamber, MD  ibuprofen (ADVIL) 600 MG tablet TAKE 1 TABLET(600 MG) BY MOUTH EVERY 6 HOURS Patient not taking: Reported on 07/10/2021 02/12/21   Venora Maples, MD  Lidocaine, Anorectal, 5 % GEL Apply 1 application topically daily as needed. Patient not taking: Reported on 07/10/2021 04/10/21   Levie Heritage, DO  medroxyPROGESTERone (PROVERA) 10 MG tablet Take 1 tablet (10 mg total) by mouth daily. Patient not taking: Reported on 07/10/2021 04/10/21   Levie Heritage, DO    Physical Exam: BP 118/80   Pulse (!) 132   Temp 98.9 F (37.2 C) (Oral)   Resp 19   Ht 5\' 3"  (1.6 m)   Wt 56.7 kg   LMP  (LMP Unknown)   SpO2 97%   BMI 22.14 kg/m   General: 26 y.o. year-old female well developed well  nourished in no acute distress.  Alert and oriented x3. Cardiovascular: Tachycardic with no rubs or gallops.  No thyromegaly or JVD noted.  No lower extremity edema. 2/4 pulses in all 4 extremities. Respiratory: Mild diffuse wheezing. Good inspiratory effort. Abdomen: Soft nontender nondistended with normal bowel sounds x4 quadrants. Muskuloskeletal: No cyanosis, clubbing or edema noted bilaterally Neuro: CN II-XII intact, strength, sensation, reflexes Skin: No ulcerative lesions noted or rashes Psychiatry: Judgement and insight  appear normal. Mood is appropriate for condition and setting          Labs on Admission:  Basic Metabolic Panel: Recent Labs  Lab 06/05/22 2152  NA 138  K 3.4*  CL 108  CO2 23  GLUCOSE 104*  BUN 13  CREATININE 0.82  CALCIUM 8.8*   Liver Function Tests: No results for input(s): "AST", "ALT", "ALKPHOS", "BILITOT", "PROT", "ALBUMIN" in the last 168 hours. No results for input(s): "LIPASE", "AMYLASE" in the last 168 hours. No results for input(s): "AMMONIA" in the last 168 hours. CBC: Recent Labs  Lab 06/05/22 2152  WBC 17.2*  NEUTROABS 15.2*  HGB 16.3*  HCT 48.1*  MCV 94.9  PLT 287   Cardiac Enzymes: No results for input(s): "CKTOTAL", "CKMB", "CKMBINDEX", "TROPONINI" in the last 168 hours.  BNP (last 3 results) No results for input(s): "BNP" in the last 8760 hours.  ProBNP (last 3 results) No results for input(s): "PROBNP" in the last 8760 hours.  CBG: No results for input(s): "GLUCAP" in the last 168 hours.  Radiological Exams on Admission: DG Chest Port 1 View  Result Date: 06/05/2022 CLINICAL DATA:  Shortness of breath EXAM: PORTABLE CHEST 1 VIEW COMPARISON:  01/17/2020 FINDINGS: The heart size and mediastinal contours are within normal limits. Both lungs are clear. The visualized skeletal structures are unremarkable. IMPRESSION: No active disease. Electronically Signed   By: Charlett Nose M.D.   On: 06/05/2022 22:13    EKG: I independently viewed the EKG done and my findings are as followed: None available at the time of the visit, ordered.  Assessment/Plan Present on Admission:  Asthma exacerbation  Principal Problem:   Asthma exacerbation  Acute asthma exacerbation, suspect triggered by rhinovirus/enterovirus infection COVID-19 screening test negative Respiratory panel by PCR positive for rhinovirus/enterovirus. Continue bronchodilators and IV steroids for diffuse wheezing. Mobilize as tolerated  Hypokalemia Repleted orally Replete electrolytes as  indicated. Repeat BMP in the morning  Sinus tachycardia likely secondary to acute asthma exacerbation TSH is pending Heart rate is improving. Continue to treat underlying condition Monitor on telemetry.   DVT prophylaxis: SCDs, the patient is ambulatory.  Code Status: Full code  Family Communication: Updated significant other at bedside.  Disposition Plan: Admitted to progressive care unit  Consults called: None  Admission status: Observation status.   Status is: Observation    Darlin Drop MD [facility] Pager [phone]  If 7PM-7AM, please contact night-coverage [email or web address] Password [facility]  06/05/2022, 11:07 PM

## 2022-06-05 NOTE — ED Triage Notes (Signed)
Shob beginning this morning. Breathing labored.   Used ~ 12 puffs of over the counter inhaler with no improvement.

## 2022-06-06 ENCOUNTER — Encounter (HOSPITAL_COMMUNITY): Payer: Self-pay | Admitting: Internal Medicine

## 2022-06-06 DIAGNOSIS — B9710 Unspecified enterovirus as the cause of diseases classified elsewhere: Secondary | ICD-10-CM | POA: Diagnosis not present

## 2022-06-06 DIAGNOSIS — R000 Tachycardia, unspecified: Secondary | ICD-10-CM | POA: Diagnosis not present

## 2022-06-06 DIAGNOSIS — D751 Secondary polycythemia: Secondary | ICD-10-CM | POA: Diagnosis not present

## 2022-06-06 DIAGNOSIS — J45901 Unspecified asthma with (acute) exacerbation: Secondary | ICD-10-CM | POA: Diagnosis not present

## 2022-06-06 DIAGNOSIS — Z83438 Family history of other disorder of lipoprotein metabolism and other lipidemia: Secondary | ICD-10-CM | POA: Diagnosis not present

## 2022-06-06 DIAGNOSIS — J4520 Mild intermittent asthma, uncomplicated: Secondary | ICD-10-CM | POA: Diagnosis not present

## 2022-06-06 DIAGNOSIS — Z23 Encounter for immunization: Secondary | ICD-10-CM | POA: Diagnosis not present

## 2022-06-06 DIAGNOSIS — E860 Dehydration: Secondary | ICD-10-CM | POA: Diagnosis not present

## 2022-06-06 DIAGNOSIS — E872 Acidosis, unspecified: Secondary | ICD-10-CM | POA: Diagnosis not present

## 2022-06-06 DIAGNOSIS — A6009 Herpesviral infection of other urogenital tract: Secondary | ICD-10-CM | POA: Diagnosis not present

## 2022-06-06 DIAGNOSIS — E876 Hypokalemia: Secondary | ICD-10-CM | POA: Diagnosis not present

## 2022-06-06 DIAGNOSIS — B9789 Other viral agents as the cause of diseases classified elsewhere: Secondary | ICD-10-CM | POA: Diagnosis not present

## 2022-06-06 DIAGNOSIS — D72829 Elevated white blood cell count, unspecified: Secondary | ICD-10-CM | POA: Diagnosis not present

## 2022-06-06 DIAGNOSIS — Z7951 Long term (current) use of inhaled steroids: Secondary | ICD-10-CM | POA: Diagnosis not present

## 2022-06-06 DIAGNOSIS — R0602 Shortness of breath: Secondary | ICD-10-CM | POA: Diagnosis not present

## 2022-06-06 DIAGNOSIS — Z20822 Contact with and (suspected) exposure to covid-19: Secondary | ICD-10-CM | POA: Diagnosis not present

## 2022-06-06 DIAGNOSIS — Z79899 Other long term (current) drug therapy: Secondary | ICD-10-CM | POA: Diagnosis not present

## 2022-06-06 LAB — RESPIRATORY PANEL BY PCR

## 2022-06-06 LAB — CBC WITH DIFFERENTIAL/PLATELET
Abs Immature Granulocytes: 0.05 10*3/uL (ref 0.00–0.07)
Basophils Absolute: 0 10*3/uL (ref 0.0–0.1)
Basophils Relative: 0 %
Eosinophils Absolute: 0 10*3/uL (ref 0.0–0.5)
Eosinophils Relative: 0 %
HCT: 44.4 % (ref 36.0–46.0)
Hemoglobin: 15 g/dL (ref 12.0–15.0)
Immature Granulocytes: 0 %
Lymphocytes Relative: 2 %
Lymphs Abs: 0.3 10*3/uL — ABNORMAL LOW (ref 0.7–4.0)
MCH: 31.8 pg (ref 26.0–34.0)
MCHC: 33.8 g/dL (ref 30.0–36.0)
MCV: 94.1 fL (ref 80.0–100.0)
Monocytes Absolute: 0 10*3/uL — ABNORMAL LOW (ref 0.1–1.0)
Monocytes Relative: 0 %
Neutro Abs: 13.5 10*3/uL — ABNORMAL HIGH (ref 1.7–7.7)
Neutrophils Relative %: 98 %
Platelets: 293 10*3/uL (ref 150–400)
RBC: 4.72 MIL/uL (ref 3.87–5.11)
RDW: 11.4 % — ABNORMAL LOW (ref 11.5–15.5)
WBC: 13.9 10*3/uL — ABNORMAL HIGH (ref 4.0–10.5)
nRBC: 0 % (ref 0.0–0.2)

## 2022-06-06 LAB — BASIC METABOLIC PANEL
Anion gap: 6 (ref 5–15)
BUN: 10 mg/dL (ref 6–20)
CO2: 21 mmol/L — ABNORMAL LOW (ref 22–32)
Calcium: 9.1 mg/dL (ref 8.9–10.3)
Chloride: 113 mmol/L — ABNORMAL HIGH (ref 98–111)
Creatinine, Ser: 0.79 mg/dL (ref 0.44–1.00)
GFR, Estimated: >60 mL/min (ref >60)
Glucose, Bld: 171 mg/dL — ABNORMAL HIGH (ref 70–99)
Potassium: 3.9 mmol/L (ref 3.5–5.1)
Sodium: 140 mmol/L (ref 135–145)

## 2022-06-06 LAB — PHOSPHORUS: Phosphorus: 2.6 mg/dL (ref 2.5–4.6)

## 2022-06-06 LAB — MAGNESIUM: Magnesium: 2.5 mg/dL — ABNORMAL HIGH (ref 1.7–2.4)

## 2022-06-06 LAB — TSH: TSH: 0.61 u[IU]/mL (ref 0.350–4.500)

## 2022-06-06 MED ORDER — POLYETHYLENE GLYCOL 3350 17 G PO PACK: 17.0000 g | PACK | Freq: Every day | ORAL | Status: DC | PRN

## 2022-06-06 MED ORDER — ENSURE ENLIVE PO LIQD
237.0000 mL | Freq: Two times a day (BID) | ORAL | Status: DC
Start: 2022-06-06 — End: 2022-06-07
  Administered 2022-06-06 – 2022-06-07 (×2): 237 mL via ORAL

## 2022-06-06 MED ORDER — METHYLPREDNISOLONE SODIUM SUCC 125 MG IJ SOLR
120.0000 mg | INTRAMUSCULAR | Status: DC
  Administered 2022-06-06: 120 mg via INTRAVENOUS
  Filled 2022-06-06: qty 2

## 2022-06-06 MED ORDER — LEVALBUTEROL HCL 0.63 MG/3ML IN NEBU
0.6300 mg | INHALATION_SOLUTION | Freq: Four times a day (QID) | RESPIRATORY_TRACT | Status: DC
  Administered 2022-06-06 – 2022-06-07 (×6): 0.63 mg via RESPIRATORY_TRACT
  Filled 2022-06-06 (×6): qty 3

## 2022-06-06 MED ORDER — MELATONIN 5 MG PO TABS: 5.0000 mg | ORAL_TABLET | Freq: Every evening | ORAL | Status: DC | PRN

## 2022-06-06 MED ORDER — ALBUTEROL SULFATE (2.5 MG/3ML) 0.083% IN NEBU: 2.5000 mg | INHALATION_SOLUTION | RESPIRATORY_TRACT | Status: DC | PRN

## 2022-06-06 MED ORDER — POTASSIUM CHLORIDE CRYS ER 20 MEQ PO TBCR
40.0000 meq | EXTENDED_RELEASE_TABLET | Freq: Once | ORAL | Status: AC
  Administered 2022-06-06: 40 meq via ORAL
  Filled 2022-06-06: qty 2

## 2022-06-06 MED ORDER — IPRATROPIUM BROMIDE 0.02 % IN SOLN
0.5000 mg | Freq: Four times a day (QID) | RESPIRATORY_TRACT | Status: DC
  Administered 2022-06-06 – 2022-06-07 (×6): 0.5 mg via RESPIRATORY_TRACT
  Filled 2022-06-06 (×6): qty 2.5

## 2022-06-06 MED ORDER — ACETAMINOPHEN 325 MG PO TABS: 650.0000 mg | ORAL_TABLET | Freq: Four times a day (QID) | ORAL | Status: DC | PRN

## 2022-06-06 MED ORDER — PNEUMOCOCCAL VAC POLYVALENT 25 MCG/0.5ML IJ INJ
0.5000 mL | INJECTION | INTRAMUSCULAR | Status: AC
  Administered 2022-06-07: 0.5 mL via INTRAMUSCULAR
  Filled 2022-06-06: qty 0.5

## 2022-06-06 MED ORDER — BUDESONIDE 0.25 MG/2ML IN SUSP
0.2500 mg | Freq: Two times a day (BID) | RESPIRATORY_TRACT | Status: DC
  Administered 2022-06-06 – 2022-06-07 (×3): 0.25 mg via RESPIRATORY_TRACT
  Filled 2022-06-06 (×3): qty 2

## 2022-06-06 MED ORDER — GUAIFENESIN ER 600 MG PO TB12
1200.0000 mg | ORAL_TABLET | Freq: Two times a day (BID) | ORAL | Status: DC
  Administered 2022-06-06 – 2022-06-07 (×3): 1200 mg via ORAL
  Filled 2022-06-06 (×3): qty 2

## 2022-06-06 MED ORDER — SODIUM CHLORIDE 0.9 % IV BOLUS
1000.0000 mL | Freq: Once | INTRAVENOUS | Status: AC
  Administered 2022-06-06: 1000 mL via INTRAVENOUS

## 2022-06-06 MED ORDER — METHYLPREDNISOLONE SODIUM SUCC 40 MG IJ SOLR
40.0000 mg | Freq: Two times a day (BID) | INTRAMUSCULAR | Status: DC
  Administered 2022-06-06: 40 mg via INTRAVENOUS
  Filled 2022-06-06: qty 1

## 2022-06-06 MED ORDER — GUAIFENESIN-DM 100-10 MG/5ML PO SYRP: 5.0000 mL | ORAL_SOLUTION | ORAL | Status: DC | PRN

## 2022-06-06 MED ORDER — ARFORMOTEROL TARTRATE 15 MCG/2ML IN NEBU
15.0000 ug | INHALATION_SOLUTION | Freq: Two times a day (BID) | RESPIRATORY_TRACT | Status: DC
  Administered 2022-06-06 – 2022-06-07 (×3): 15 ug via RESPIRATORY_TRACT
  Filled 2022-06-06 (×4): qty 2

## 2022-06-06 MED ORDER — IPRATROPIUM-ALBUTEROL 0.5-2.5 (3) MG/3ML IN SOLN
3.0000 mL | RESPIRATORY_TRACT | Status: DC
  Administered 2022-06-06 (×3): 3 mL via RESPIRATORY_TRACT
  Filled 2022-06-06 (×3): qty 3

## 2022-06-06 MED ORDER — PROCHLORPERAZINE EDISYLATE 10 MG/2ML IJ SOLN: 10.0000 mg | Freq: Four times a day (QID) | INTRAMUSCULAR | Status: DC | PRN

## 2022-06-06 NOTE — ED Notes (Signed)
Respiratory at bedside will update temp when they leave.

## 2022-06-06 NOTE — ED Notes (Signed)
High heart rate noted, Provider notified.

## 2022-06-06 NOTE — Progress Notes (Addendum)
PROGRESS NOTE    Sheri Walker  NWG:[id number] DOB: 10/23/1995 DOA: 06/05/2022 PCP: Patient, No Pcp Per   Brief Narrative:  The patient is a 26 year old female with a past medical history significant for but not limited to asthma, history of allergies, history of pyelonephritis during pregnancy, history of HSV-2 infection as well as other comorbidities who presents to the [facility] emergency department due to gradually worsening shortness of breath for 3 days duration.  She states that her daughter is sick and endorses exposure to sick contacts in her household.  She states that she ran out of her asthma inhalers and self medicated with over-the-counter Primatene mist without improvement of her symptoms.  Because of her worsening symptoms which presented to the ED for further evaluation and management.  She denies any subjective fever or chills and further work-up in the ED revealed that she had an acute asthma exacerbation in the setting of rhino enterovirus.  COVID-19 testing was done and is negative.  Chest x-ray done and showed no active disease on official read.  In the ED she was given IV magnesium, IV Solu-Medrol and bronchodilators with some improvement of her symptoms but she continues to remain dyspneic and tachycardic.  Currently she remains on oxygen and will attempt to wean.  She is improving but slowly so we will observe another night anticipate discharging in the next 24 to 48 hours if she is improved.  Assessment and Plan:  Acute exacerbation of asthma in the setting of viral infection with rhinovirus and enterovirus -Respiratory panel was positive for rhinovirus/enterovirus -COVID-19 testing was negative -She was initiated on bronchodilators with DuoNebs we will change this to Xopenex and Atrovent given her tachycardia as she remains significantly tachycardic; we will leave her on as needed albuterol 2.5 mg neb every 4 as needed in the -She was given IV Solu-Medrol which was  increased to 60 mg twice daily but then also changed to 120 mg daily -Repeat chest x-ray in a.m. -We will add guaifenesin 1200 mg p.o. twice daily, flutter valve, incentive spirometry as well as a peak flow meter for asthma monitoring -Patient had a slight leukocytosis and this is trending down now but expect it to be elevated in the setting of steroid demargination -Patient normally takes albuterol inhalers and Symbicort however states that she ran out of these so she will need refills at discharge -We will add budesonide and Brovana while she is hospitalized -SpO2: 98 % O2 Flow Rate (L/min): 2 L/min FiO2 (%): 28 % -Continuous pulse oximetry maintain O2 saturation greater 90% -Continue supplemental oxygen via nasal cannula wean O2 as tolerated -Given viral infection chronically do not suspect that she will need antibiotics at this time -If she feels improved significantly may warrant a pulmonary consult -She will need an ambulatory home O2 screen prior to discharge and repeat chest x-ray in the a.m.  Erythrocytosis -Likely was hemoconcentrated on admission in setting of dehydration -Given IV fluid hydration and her hemoglobin/hematocrit has gone from 16.3/48.1 is now 15.0/44.4 -Continue to monitor and trend and repeat CMP in a.m.  Metabolic acidosis -Mild and patient's CO2 is now 21, anion gap is 6, chloride level 113 -Continue IV fluid hydration as below -Continue monitor trend and repeat CMP in a.m.  Hypokalemia -Mild and improved -Potassium up from 3.4 is now 3.9 -Continue monitor and replete as necessary and mag levels 2.5 -Repeat CMP in the a.m.  Leukocytosis -Likely reactive in the setting of viral infection which is now improving -  WBC went from 17.2 is now 13.9 -Continue monitor and trend and repeat CMP in the a.m.  Sinus Tachycardia -In the setting of underlying illness with also effective bronchodilators -We will switch DuoNeb to Xopenex and Atrovent -TSH was  0.610 -We will give a normal saline bolus of 1 L; she already received a lactated Ringer's bolus of 1 L -We will continue lactated Ringer's at 75 MLS per hour -Continue to monitor on telemetry  DVT prophylaxis: SCDs Start: 06/06/22 0042    Code Status: Full Code Family Communication: No family currently at bedside  Disposition Plan:  Level of care: Progressive Status is: Observation The patient remains OBS appropriate and will d/c before 2 midnights.   Consultants:  None  Procedures:  None  Antimicrobials:  Anti-infectives (From admission, onward)    None       Subjective: Seen and examined at bedside and states that she is feeling better but still is not back to her baseline.  Heart rate was still in the 130s and she still has some wheezing.  States that she is overall improving.  No other concerns or complaints at this time and anticipating discharging home the next 24 to 48 hours if she is significantly improved.  Objective: Vitals:   06/06/22 0745 06/06/22 0834 06/06/22 0917 06/06/22 0918  BP: 115/69  133/89   Pulse: 96 (!) 106 (!) 121   Resp: (!) 23 (!) 23 15   Temp:    98.2 F (36.8 C)  TempSrc:    Oral  SpO2: 93% 97% 98%   Weight:      Height:        Intake/Output Summary (Last 24 hours) at 06/06/2022 0865 Last data filed at 06/06/2022 0126 Gross per 24 hour  Intake 1051.63 ml  Output --  Net 1051.63 ml   Filed Weights   06/05/22 2129  Weight: 56.7 kg   Examination: Physical Exam:  Constitutional: WN/WD thin female in no real acute distress but is wearing supplemental oxygen Neck: Appears normal, supple, no cervical masses, normal ROM, no appreciable thyromegaly; no appreciable JVD Respiratory: Diminished to auscultation bilaterally with coarse breath sounds and has some expiratory wheezing noted.  No appreciable rhonchi, crackles or rails.  She is unlabored breathing and does not appear to be tachypneic but is on supplemental oxygen via nasal  cannula Cardiovascular: Tachycardic rate but regular rhythm with rates in the 130s, no murmurs / rubs / gallops. S1 and S2 auscultated. No extremity edema.  Abdomen: Soft, non-tender, non-distended. Bowel sounds positive.  GU: Deferred. Musculoskeletal: No clubbing / cyanosis of digits/nails. No joint deformity upper and lower extremities.  Skin: No rashes, lesions, ulcers on limited skin evaluation. No induration; Warm and dry.  Neurologic: CN 2-12 grossly intact with no focal deficits. Romberg sign and cerebellar reflexes not assessed.  Psychiatric: Normal judgment and insight. Alert and oriented x 3. Normal mood and appropriate affect.   Data Reviewed: I have personally reviewed following labs and imaging studies  CBC: Recent Labs  Lab 06/05/22 2152 06/06/22 0454  WBC 17.2* 13.9*  NEUTROABS 15.2* 13.5*  HGB 16.3* 15.0  HCT 48.1* 44.4  MCV 94.9 94.1  PLT 287 293   Basic Metabolic Panel: Recent Labs  Lab 06/05/22 2152 06/06/22 0454  NA 138 140  K 3.4* 3.9  CL 108 113*  CO2 23 21*  GLUCOSE 104* 171*  BUN 13 10  CREATININE 0.82 0.79  CALCIUM 8.8* 9.1  MG  --  2.5*  PHOS  --  2.6   GFR: Estimated Creatinine Clearance: 88.2 mL/min (by C-G formula based on SCr of 0.79 mg/dL). Liver Function Tests: No results for input(s): "AST", "ALT", "ALKPHOS", "BILITOT", "PROT", "ALBUMIN" in the last 168 hours. No results for input(s): "LIPASE", "AMYLASE" in the last 168 hours. No results for input(s): "AMMONIA" in the last 168 hours. Coagulation Profile: No results for input(s): "INR", "PROTIME" in the last 168 hours. Cardiac Enzymes: No results for input(s): "CKTOTAL", "CKMB", "CKMBINDEX", "TROPONINI" in the last 168 hours. BNP (last 3 results) No results for input(s): "PROBNP" in the last 8760 hours. HbA1C: No results for input(s): "HGBA1C" in the last 72 hours. CBG: No results for input(s): "GLUCAP" in the last 168 hours. Lipid Profile: No results for input(s): "CHOL",  "HDL", "LDLCALC", "TRIG", "CHOLHDL", "LDLDIRECT" in the last 72 hours. Thyroid Function Tests: Recent Labs    06/06/22 0454  TSH 0.610   Anemia Panel: No results for input(s): "VITAMINB12", "FOLATE", "FERRITIN", "TIBC", "IRON", "RETICCTPCT" in the last 72 hours. Sepsis Labs: No results for input(s): "PROCALCITON", "LATICACIDVEN" in the last 168 hours.  Recent Results (from the past 240 hour(s))  SARS Coronavirus 2 by RT PCR (hospital order, performed in [facility] hospital lab) *cepheid single result test* Anterior Nasal Swab     Status: None   Collection Time: 06/05/22  9:37 PM   Specimen: Anterior Nasal Swab  Result Value Ref Range Status   SARS Coronavirus 2 by RT PCR NEGATIVE NEGATIVE Final    Comment: (NOTE) SARS-CoV-2 target nucleic acids are NOT DETECTED.  The SARS-CoV-2 RNA is generally detectable in upper and lower respiratory specimens during the acute phase of infection. The lowest concentration of SARS-CoV-2 viral copies this assay can detect is 250 copies / mL. A negative result does not preclude SARS-CoV-2 infection and should not be used as the sole basis for treatment or other patient management decisions.  A negative result may occur with improper specimen collection / handling, submission of specimen other than nasopharyngeal swab, presence of viral mutation(s) within the areas targeted by this assay, and inadequate number of viral copies (<250 copies / mL). A negative result must be combined with clinical observations, patient history, and epidemiological information.  Fact Sheet for Patients:   RoadLapTop.co.za  Fact Sheet for Healthcare Providers: http://kim-miller.com/  This test is not yet approved or  cleared by the [place] FDA and has been authorized for detection and/or diagnosis of SARS-CoV-2 by FDA under an Emergency Use Authorization (EUA).  This EUA will remain in effect (meaning this test can  be used) for the duration of the COVID-19 declaration under Section 564(b)(1) of the Act, 21 U.S.C. section 360bbb-3(b)(1), unless the authorization is terminated or revoked sooner.  Performed at [facility], [address], [place], [place] [address]   Respiratory (~20 pathogens) panel by PCR     Status: Abnormal   Collection Time: 06/06/22 12:47 AM   Specimen: Nasopharyngeal Swab; Respiratory  Result Value Ref Range Status   Adenovirus NOT DETECTED NOT DETECTED Final   Coronavirus 229E NOT DETECTED NOT DETECTED Final    Comment: (NOTE) The Coronavirus on the Respiratory Panel, DOES NOT test for the novel  Coronavirus (2019 nCoV)    Coronavirus HKU1 NOT DETECTED NOT DETECTED Final   Coronavirus NL63 NOT DETECTED NOT DETECTED Final   Coronavirus OC43 NOT DETECTED NOT DETECTED Final   Metapneumovirus NOT DETECTED NOT DETECTED Final   Rhinovirus / Enterovirus DETECTED (A) NOT DETECTED Final   Influenza A NOT DETECTED  NOT DETECTED Final   Influenza B NOT DETECTED NOT DETECTED Final   Parainfluenza Virus 1 NOT DETECTED NOT DETECTED Final   Parainfluenza Virus 2 NOT DETECTED NOT DETECTED Final   Parainfluenza Virus 3 NOT DETECTED NOT DETECTED Final   Parainfluenza Virus 4 NOT DETECTED NOT DETECTED Final   Respiratory Syncytial Virus NOT DETECTED NOT DETECTED Final   Bordetella pertussis NOT DETECTED NOT DETECTED Final   Bordetella Parapertussis NOT DETECTED NOT DETECTED Final   Chlamydophila pneumoniae NOT DETECTED NOT DETECTED Final   Mycoplasma pneumoniae NOT DETECTED NOT DETECTED Final    Comment: Performed at [facility], [address], [place], [place] [address]    Radiology Studies: DG Chest Port 1 View  Result Date: 06/05/2022 CLINICAL DATA:  Shortness of breath EXAM: PORTABLE CHEST 1 VIEW COMPARISON:  01/17/2020 FINDINGS: The heart size and mediastinal contours are within normal limits. Both lungs are clear. The visualized skeletal structures are  unremarkable. IMPRESSION: No active disease. Electronically Signed   By: Charlett Nose M.D.   On: 06/05/2022 22:13    Scheduled Meds:  guaiFENesin  1,200 mg Oral BID   ipratropium-albuterol  3 mL Nebulization Q4H   methylPREDNISolone (SOLU-MEDROL) injection  120 mg Intravenous Q24H   Continuous Infusions:  lactated ringers 75 mL/hr at 06/06/22 0118    LOS: 0 days   Marguerita Merles, DO [facility] Available via Epic secure chat 7am-7pm After these hours, please refer to coverage provider listed on [email or web address] 06/06/2022, 9:21 AM

## 2022-06-06 NOTE — Plan of Care (Signed)
  Problem: Education: Goal: Knowledge of General Education information will improve Description: Including pain rating scale, medication(s)/side effects and non-pharmacologic comfort measures Outcome: Progressing   Problem: Health Behavior/Discharge Planning: Goal: Ability to manage health-related needs will improve Outcome: Progressing   Problem: Clinical Measurements: Goal: Diagnostic test results will improve Outcome: Progressing Goal: Respiratory complications will improve Outcome: Progressing   Problem: Activity: Goal: Risk for activity intolerance will decrease Outcome: Progressing   

## 2022-06-07 ENCOUNTER — Inpatient Hospital Stay (HOSPITAL_COMMUNITY): Payer: Medicaid Other

## 2022-06-07 DIAGNOSIS — J4520 Mild intermittent asthma, uncomplicated: Secondary | ICD-10-CM | POA: Diagnosis not present

## 2022-06-07 DIAGNOSIS — J45901 Unspecified asthma with (acute) exacerbation: Secondary | ICD-10-CM | POA: Diagnosis not present

## 2022-06-07 LAB — COMPREHENSIVE METABOLIC PANEL
ALT: 16 U/L (ref 0–44)
AST: 14 U/L — ABNORMAL LOW (ref 15–41)
Albumin: 4.3 g/dL (ref 3.5–5.0)
Alkaline Phosphatase: 38 U/L (ref 38–126)
Anion gap: 6 (ref 5–15)
BUN: 12 mg/dL (ref 6–20)
CO2: 22 mmol/L (ref 22–32)
Calcium: 9.2 mg/dL (ref 8.9–10.3)
Chloride: 111 mmol/L (ref 98–111)
Creatinine, Ser: 0.63 mg/dL (ref 0.44–1.00)
GFR, Estimated: >60 mL/min (ref >60)
Glucose, Bld: 156 mg/dL — ABNORMAL HIGH (ref 70–99)
Potassium: 4.2 mmol/L (ref 3.5–5.1)
Sodium: 139 mmol/L (ref 135–145)
Total Bilirubin: 0.4 mg/dL (ref 0.3–1.2)
Total Protein: 7.4 g/dL (ref 6.5–8.1)

## 2022-06-07 LAB — CBC WITH DIFFERENTIAL/PLATELET
Abs Immature Granulocytes: 0.04 10*3/uL (ref 0.00–0.07)
Basophils Absolute: 0 10*3/uL (ref 0.0–0.1)
Basophils Relative: 0 %
Eosinophils Absolute: 0 10*3/uL (ref 0.0–0.5)
Eosinophils Relative: 0 %
HCT: 43.1 % (ref 36.0–46.0)
Hemoglobin: 14.2 g/dL (ref 12.0–15.0)
Immature Granulocytes: 0 %
Lymphocytes Relative: 3 %
Lymphs Abs: 0.4 10*3/uL — ABNORMAL LOW (ref 0.7–4.0)
MCH: 31.5 pg (ref 26.0–34.0)
MCHC: 32.9 g/dL (ref 30.0–36.0)
MCV: 95.6 fL (ref 80.0–100.0)
Monocytes Absolute: 0.2 10*3/uL (ref 0.1–1.0)
Monocytes Relative: 1 %
Neutro Abs: 11.4 10*3/uL — ABNORMAL HIGH (ref 1.7–7.7)
Neutrophils Relative %: 96 %
Platelets: 307 10*3/uL (ref 150–400)
RBC: 4.51 MIL/uL (ref 3.87–5.11)
RDW: 11.9 % (ref 11.5–15.5)
WBC: 12 10*3/uL — ABNORMAL HIGH (ref 4.0–10.5)
nRBC: 0 % (ref 0.0–0.2)

## 2022-06-07 LAB — MAGNESIUM: Magnesium: 2 mg/dL (ref 1.7–2.4)

## 2022-06-07 LAB — HIV ANTIBODY (ROUTINE TESTING W REFLEX): HIV Screen 4th Generation wRfx: NONREACTIVE

## 2022-06-07 LAB — PHOSPHORUS: Phosphorus: 2.9 mg/dL (ref 2.5–4.6)

## 2022-06-07 MED ORDER — BUDESONIDE-FORMOTEROL FUMARATE 80-4.5 MCG/ACT IN AERO: 2.0000 | INHALATION_SPRAY | Freq: Two times a day (BID) | RESPIRATORY_TRACT | 0 refills | Status: DC

## 2022-06-07 MED ORDER — AZITHROMYCIN 500 MG PO TABS: 500.0000 mg | ORAL_TABLET | Freq: Every day | ORAL | 0 refills | Status: AC

## 2022-06-07 MED ORDER — ACETAMINOPHEN 325 MG PO TABS
650.0000 mg | ORAL_TABLET | Freq: Four times a day (QID) | ORAL | 0 refills | Status: AC | PRN
Start: 2022-06-07 — End: ?

## 2022-06-07 MED ORDER — GUAIFENESIN ER 600 MG PO TB12: 600.0000 mg | ORAL_TABLET | Freq: Two times a day (BID) | ORAL | 0 refills | Status: AC

## 2022-06-07 MED ORDER — AZITHROMYCIN 250 MG PO TABS
500.0000 mg | ORAL_TABLET | Freq: Every day | ORAL | Status: DC
  Administered 2022-06-07: 500 mg via ORAL
  Filled 2022-06-07: qty 2

## 2022-06-07 MED ORDER — ENSURE ENLIVE PO LIQD: 237.0000 mL | Freq: Two times a day (BID) | ORAL | 12 refills | Status: AC

## 2022-06-07 MED ORDER — PREDNISONE 10 MG (21) PO TBPK: ORAL_TABLET | ORAL | 0 refills | Status: AC

## 2022-06-07 MED ORDER — ALBUTEROL SULFATE HFA 108 (90 BASE) MCG/ACT IN AERS: 2.0000 | INHALATION_SPRAY | Freq: Four times a day (QID) | RESPIRATORY_TRACT | 0 refills | Status: DC | PRN

## 2022-06-07 NOTE — Plan of Care (Signed)
  Problem: Education: Goal: Knowledge of General Education information will improve Description: Including pain rating scale, medication(s)/side effects and non-pharmacologic comfort measures 06/07/2022 1223 by Rosemary Holms, Veatrice Bourbon, RN Outcome: Completed/Met 06/07/2022 1223 by Rosemary Holms, Veatrice Bourbon, RN Outcome: Adequate for Discharge   Problem: Health Behavior/Discharge Planning: Goal: Ability to manage health-related needs will improve 06/07/2022 1223 by Odessa Fleming, RN Outcome: Completed/Met 06/07/2022 1223 by Rosemary Holms, Veatrice Bourbon, RN Outcome: Adequate for Discharge   Problem: Clinical Measurements: Goal: Ability to maintain clinical measurements within normal limits will improve 06/07/2022 1223 by Odessa Fleming, RN Outcome: Completed/Met 06/07/2022 1223 by Odessa Fleming, RN Outcome: Adequate for Discharge Goal: Will remain free from infection 06/07/2022 1223 by Odessa Fleming, RN Outcome: Completed/Met 06/07/2022 1223 by Odessa Fleming, RN Outcome: Adequate for Discharge Goal: Diagnostic test results will improve 06/07/2022 1223 by Odessa Fleming, RN Outcome: Completed/Met 06/07/2022 1223 by Rosemary Holms, Veatrice Bourbon, RN Outcome: Adequate for Discharge Goal: Respiratory complications will improve 06/07/2022 1223 by Odessa Fleming, RN Outcome: Completed/Met 06/07/2022 1223 by Odessa Fleming, RN Outcome: Adequate for Discharge Goal: Cardiovascular complication will be avoided 06/07/2022 1223 by Odessa Fleming, RN Outcome: Completed/Met 06/07/2022 1223 by Rosemary Holms, Veatrice Bourbon, RN Outcome: Adequate for Discharge   Problem: Activity: Goal: Risk for activity intolerance will decrease 06/07/2022 1223 by Rosemary Holms, Veatrice Bourbon, RN Outcome: Completed/Met 06/07/2022 1223 by Rosemary Holms, Veatrice Bourbon, RN Outcome: Adequate for Discharge   Problem: Nutrition: Goal: Adequate nutrition will be  maintained 06/07/2022 1223 by Rosemary Holms, Veatrice Bourbon, RN Outcome: Completed/Met 06/07/2022 1223 by Rosemary Holms, Veatrice Bourbon, RN Outcome: Adequate for Discharge   Problem: Coping: Goal: Level of anxiety will decrease 06/07/2022 1223 by Odessa Fleming, RN Outcome: Completed/Met 06/07/2022 1223 by Odessa Fleming, RN Outcome: Adequate for Discharge   Problem: Elimination: Goal: Will not experience complications related to bowel motility 06/07/2022 1223 by Odessa Fleming, RN Outcome: Completed/Met 06/07/2022 1223 by Odessa Fleming, RN Outcome: Adequate for Discharge Goal: Will not experience complications related to urinary retention 06/07/2022 1223 by Odessa Fleming, RN Outcome: Completed/Met 06/07/2022 1223 by Rosemary Holms, Veatrice Bourbon, RN Outcome: Adequate for Discharge   Problem: Pain Managment: Goal: General experience of comfort will improve 06/07/2022 1223 by Odessa Fleming, RN Outcome: Completed/Met 06/07/2022 1223 by Odessa Fleming, RN Outcome: Adequate for Discharge   Problem: Safety: Goal: Ability to remain free from injury will improve 06/07/2022 1223 by Odessa Fleming, RN Outcome: Completed/Met 06/07/2022 1223 by Odessa Fleming, RN Outcome: Adequate for Discharge   Problem: Skin Integrity: Goal: Risk for impaired skin integrity will decrease 06/07/2022 1223 by Odessa Fleming, RN Outcome: Completed/Met 06/07/2022 1223 by Rosemary Holms, Veatrice Bourbon, RN Outcome: Adequate for Discharge

## 2022-06-07 NOTE — Plan of Care (Signed)

## 2022-06-07 NOTE — Discharge Summary (Signed)
Physician Discharge Summary   Patient: Sheri Walker MRN: [id number] DOB: Sep 12, 1996  Admit date:     06/05/2022  Discharge date: 06/07/22  Discharge Physician: Marguerita Merles, DO   PCP: Patient, No Pcp Per   Recommendations at discharge:   Follow-up with PCP within 1 to 2 weeks and repeat CBC, CMP, mag, Phos within 1 week Follow-up with pulmonary in outpatient setting for further evaluation  Discharge Diagnoses: Principal Problem:   Asthma exacerbation Active Problems:   Asthma  Resolved Problems:   * No resolved hospital problems. [facility] Course: The patient is a 26 year old female with a past medical history significant for but not limited to asthma, history of allergies, history of pyelonephritis during pregnancy, history of HSV-2 infection as well as other comorbidities who presents to the [facility] emergency department due to gradually worsening shortness of breath for 3 days duration.  She states that her daughter is sick and endorses exposure to sick contacts in her household.  She states that she ran out of her asthma inhalers and self medicated with over-the-counter Primatene mist without improvement of her symptoms.  Because of her worsening symptoms which presented to the ED for further evaluation and management.  She denies any subjective fever or chills and further work-up in the ED revealed that she had an acute asthma exacerbation in the setting of rhino enterovirus.  COVID-19 testing was done and is negative.  Chest x-ray done and showed no active disease on official read.  In the ED she was given IV magnesium, IV Solu-Medrol and bronchodilators with some improvement of her symptoms but she continues to remain dyspneic and tachycardic.  Currently she remains on oxygen and will attempt to wean.  She is improving but slowly so we will observe another night anticipate discharging in the next 24 to 48 hours if she is improved.    Assessment and Plan:  Acute  exacerbation of asthma in the setting of viral infection with rhinovirus and enterovirus -Respiratory panel was positive for rhinovirus/enterovirus -COVID-19 testing was negative -She was initiated on bronchodilators with DuoNebs we will change this to Xopenex and Atrovent given her tachycardia as she remains significantly tachycardic; we will leave her on as needed albuterol 2.5 mg neb every 4 as needed in the -She was given IV Solu-Medrol which was increased to 60 mg twice daily but then also changed to 120 mg daily -Repeat chest x-ray in a.m. -We will add guaifenesin 1200 mg p.o. twice daily, flutter valve, incentive spirometry as well as a peak flow meter for asthma monitoring -Patient had a slight leukocytosis and this is trending down now but expect it to be elevated in the setting of steroid demargination -Patient normally takes albuterol inhalers and Symbicort however states that she ran out of these so she will need refills at discharge -We will add budesonide and Brovana while she is hospitalized -SpO2: 98 % O2 Flow Rate (L/min): 2 L/min FiO2 (%): 28 % -Continuous pulse oximetry maintain O2 saturation greater 90% -Continue supplemental oxygen via nasal cannula wean O2 as tolerated -Given viral infection chronically do not suspect that she will need antibiotics at this time -If she feels improved significantly may warrant a pulmonary consult -She will need an ambulatory home O2 screen prior to discharge and repeat chest x-ray in the a.m.   Erythrocytosis -Likely was hemoconcentrated on admission in setting of dehydration -Given IV fluid hydration and her hemoglobin/hematocrit has gone from 16.3/48.1 is now 15.0/44.4 -Continue to monitor and  trend and repeat CMP in a.m.   Metabolic acidosis -Mild and patient's CO2 is now 21, anion gap is 6, chloride level 113 -Continue IV fluid hydration as below -Continue monitor trend and repeat CMP in a.m.   Hypokalemia -Mild and  improved -Potassium up from 3.4 is now 3.9 -Continue monitor and replete as necessary and mag levels 2.5 -Repeat CMP in the a.m.   Leukocytosis -Likely reactive in the setting of viral infection which is now improving -WBC went from 17.2 is now 13.9 -Continue monitor and trend and repeat CMP in the a.m.   Sinus Tachycardia -In the setting of underlying illness with also effective bronchodilators -We will switch DuoNeb to Xopenex and Atrovent -TSH was 0.610 -We will give a normal saline bolus of 1 L; she already received a lactated Ringer's bolus of 1 L -We will continue lactated Ringer's at 75 MLS per hour -Continue to monitor on telemetry  Consultants: None Procedures performed: None  Disposition: Home Diet recommendation:  Discharge Diet Orders (From admission, onward)     Start     Ordered   06/07/22 0000  Diet general        06/07/22 1047           Regular diet DISCHARGE MEDICATION: Allergies as of 06/07/2022       Reactions   Peanut-containing Drug Products Anaphylaxis   All Nuts         Medication List     STOP taking these medications    ibuprofen 600 MG tablet Commonly known as: ADVIL   medroxyPROGESTERone 10 MG tablet Commonly known as: PROVERA   PRIMATENE MIST IN       TAKE these medications    acetaminophen 325 MG tablet Commonly known as: TYLENOL Take 2 tablets (650 mg total) by mouth every 6 (six) hours as needed for mild pain, fever or headache.   albuterol 108 (90 Base) MCG/ACT inhaler Commonly known as: VENTOLIN HFA Inhale 2 puffs into the lungs every 6 (six) hours as needed for wheezing or shortness of breath.   azithromycin 500 MG tablet Commonly known as: ZITHROMAX Take 1 tablet (500 mg total) by mouth daily for 4 days.   budesonide-formoterol 80-4.5 MCG/ACT inhaler Commonly known as: Symbicort Inhale 2 puffs into the lungs 2 (two) times daily.   feeding supplement Liqd Take 237 mLs by mouth 2 (two) times daily between  meals.   guaiFENesin 600 MG 12 hr tablet Commonly known as: MUCINEX Take 1 tablet (600 mg total) by mouth 2 (two) times daily for 5 days.   Lidocaine (Anorectal) 5 % Gel Apply 1 application topically daily as needed.   predniSONE 10 MG (21) Tbpk tablet Commonly known as: STERAPRED UNI-PAK 21 TAB Take 6 Tablets Day 1, 5 Tablets Day 2, 4 Tablets Day 3, 3 Tablets Day 4, 2 Tablets Day 5, 1 Tablet Day 6 and Stop Day 7        Discharge Exam: Filed Weights   06/05/22 2129  Weight: 56.7 kg   Vitals:   06/07/22 0824 06/07/22 1352  BP:    Pulse:    Resp:    Temp:    SpO2: 93% 95%   Examination: Physical Exam:  Constitutional: WN/WD, NAD and appears calm and comfortable Eyes: PERRL, lids and conjunctivae normal, sclerae anicteric  ENMT: External Ears, Nose appear normal. Grossly normal hearing. Mucous membranes are moist. Posterior pharynx clear of any exudate or lesions. Normal dentition.  Neck: Appears normal, supple, no cervical masses, normal  ROM, no appreciable thyromegaly Respiratory: Clear to auscultation bilaterally, no wheezing, rales, rhonchi or crackles. Normal respiratory effort and patient is not tachypenic. No accessory muscle use.  Cardiovascular: RRR, no murmurs / rubs / gallops. S1 and S2 auscultated. No extremity edema. 2+ pedal pulses. No carotid bruits.  Abdomen: Soft, non-tender, non-distended. No masses palpated. No appreciable hepatosplenomegaly. Bowel sounds positive.  GU: Deferred. Musculoskeletal: No clubbing / cyanosis of digits/nails. No joint deformity upper and lower extremities. Good ROM, no contractures. Normal strength and muscle tone.  Skin: No rashes, lesions, ulcers. No induration; Warm and dry.  Neurologic: CN 2-12 grossly intact with no focal deficits. Sensation intact in all 4 Extremities, DTR normal. Strength 5/5 in all 4. Romberg sign cerebellar reflexes not assessed.  Psychiatric: Normal judgment and insight. Alert and oriented x 3. Normal  mood and appropriate affect.    Condition at discharge: stable  The results of significant diagnostics from this hospitalization (including imaging, microbiology, ancillary and laboratory) are listed below for reference.   Imaging Studies: DG CHEST PORT 1 VIEW  Result Date: 06/07/2022 CLINICAL DATA:  26 year old female history of asthma exacerbation. EXAM: PORTABLE CHEST 1 VIEW COMPARISON:  Chest x-ray 06/05/2022. FINDINGS: Lung volumes are normal. No consolidative airspace disease. No pleural effusions. No pneumothorax. No pulmonary nodule or mass noted. Pulmonary vasculature and the cardiomediastinal silhouette are within normal limits. IMPRESSION: No radiographic evidence of acute cardiopulmonary disease. Electronically Signed   By: Trudie Reed M.D.   On: 06/07/2022 06:36   DG Chest Port 1 View  Result Date: 06/05/2022 CLINICAL DATA:  Shortness of breath EXAM: PORTABLE CHEST 1 VIEW COMPARISON:  01/17/2020 FINDINGS: The heart size and mediastinal contours are within normal limits. Both lungs are clear. The visualized skeletal structures are unremarkable. IMPRESSION: No active disease. Electronically Signed   By: Charlett Nose M.D.   On: 06/05/2022 22:13    Microbiology: Results for orders placed or performed during the hospital encounter of 06/05/22  SARS Coronavirus 2 by RT PCR (hospital order, performed in [facility] hospital lab) *cepheid single result test* Anterior Nasal Swab     Status: None   Collection Time: 06/05/22  9:37 PM   Specimen: Anterior Nasal Swab  Result Value Ref Range Status   SARS Coronavirus 2 by RT PCR NEGATIVE NEGATIVE Final    Comment: (NOTE) SARS-CoV-2 target nucleic acids are NOT DETECTED.  The SARS-CoV-2 RNA is generally detectable in upper and lower respiratory specimens during the acute phase of infection. The lowest concentration of SARS-CoV-2 viral copies this assay can detect is 250 copies / mL. A negative result does not preclude SARS-CoV-2  infection and should not be used as the sole basis for treatment or other patient management decisions.  A negative result may occur with improper specimen collection / handling, submission of specimen other than nasopharyngeal swab, presence of viral mutation(s) within the areas targeted by this assay, and inadequate number of viral copies (<250 copies / mL). A negative result must be combined with clinical observations, patient history, and epidemiological information.  Fact Sheet for Patients:   RoadLapTop.co.za  Fact Sheet for Healthcare Providers: http://kim-miller.com/  This test is not yet approved or  cleared by the [place] FDA and has been authorized for detection and/or diagnosis of SARS-CoV-2 by FDA under an Emergency Use Authorization (EUA).  This EUA will remain in effect (meaning this test can be used) for the duration of the COVID-19 declaration under Section 564(b)(1) of the Act, 21 U.S.C. section 360bbb-3(b)(1),  unless the authorization is terminated or revoked sooner.  Performed at [facility], [address], [place], [place] [address]   Respiratory (~20 pathogens) panel by PCR     Status: Abnormal   Collection Time: 06/06/22 12:47 AM   Specimen: Nasopharyngeal Swab; Respiratory  Result Value Ref Range Status   Adenovirus NOT DETECTED NOT DETECTED Final   Coronavirus 229E NOT DETECTED NOT DETECTED Final    Comment: (NOTE) The Coronavirus on the Respiratory Panel, DOES NOT test for the novel  Coronavirus (2019 nCoV)    Coronavirus HKU1 NOT DETECTED NOT DETECTED Final   Coronavirus NL63 NOT DETECTED NOT DETECTED Final   Coronavirus OC43 NOT DETECTED NOT DETECTED Final   Metapneumovirus NOT DETECTED NOT DETECTED Final   Rhinovirus / Enterovirus DETECTED (A) NOT DETECTED Final   Influenza A NOT DETECTED NOT DETECTED Final   Influenza B NOT DETECTED NOT DETECTED Final   Parainfluenza Virus 1  NOT DETECTED NOT DETECTED Final   Parainfluenza Virus 2 NOT DETECTED NOT DETECTED Final   Parainfluenza Virus 3 NOT DETECTED NOT DETECTED Final   Parainfluenza Virus 4 NOT DETECTED NOT DETECTED Final   Respiratory Syncytial Virus NOT DETECTED NOT DETECTED Final   Bordetella pertussis NOT DETECTED NOT DETECTED Final   Bordetella Parapertussis NOT DETECTED NOT DETECTED Final   Chlamydophila pneumoniae NOT DETECTED NOT DETECTED Final   Mycoplasma pneumoniae NOT DETECTED NOT DETECTED Final    Comment: Performed at [facility], [address], [place], [place] [address]    Labs: CBC: Recent Labs  Lab 06/05/22 2152 06/06/22 0454 06/07/22 0500  WBC 17.2* 13.9* 12.0*  NEUTROABS 15.2* 13.5* 11.4*  HGB 16.3* 15.0 14.2  HCT 48.1* 44.4 43.1  MCV 94.9 94.1 95.6  PLT 287 293 307   Basic Metabolic Panel: Recent Labs  Lab 06/05/22 2152 06/06/22 0454 06/07/22 0500  NA 138 140 139  K 3.4* 3.9 4.2  CL 108 113* 111  CO2 23 21* 22  GLUCOSE 104* 171* 156*  BUN 13 10 12   CREATININE 0.82 0.79 0.63  CALCIUM 8.8* 9.1 9.2  MG  --  2.5* 2.0  PHOS  --  2.6 2.9   Liver Function Tests: Recent Labs  Lab 06/07/22 0500  AST 14*  ALT 16  ALKPHOS 38  BILITOT 0.4  PROT 7.4  ALBUMIN 4.3   CBG: No results for input(s): "GLUCAP" in the last 168 hours.  Discharge time spent: greater than 30 minutes.  Signed: 06/09/22, DO [facility] 06/07/2022

## 2022-07-10 ENCOUNTER — Ambulatory Visit (INDEPENDENT_AMBULATORY_CARE_PROVIDER_SITE_OTHER): Payer: Medicaid Other | Admitting: Obstetrics and Gynecology

## 2022-07-10 ENCOUNTER — Encounter: Payer: Self-pay | Admitting: Obstetrics and Gynecology

## 2022-07-10 ENCOUNTER — Other Ambulatory Visit (HOSPITAL_COMMUNITY)
Admission: RE | Admit: 2022-07-10 | Discharge: 2022-07-10 | Disposition: A | Discharge status: Home / Self Care | Payer: Medicaid Other | Source: Ambulatory Visit | Attending: Obstetrics and Gynecology | Admitting: Obstetrics and Gynecology

## 2022-07-10 ENCOUNTER — Other Ambulatory Visit: Payer: Self-pay

## 2022-07-10 VITALS — BP 127/71 | HR 97 | Wt 125.2 lb

## 2022-07-10 DIAGNOSIS — Z23 Encounter for immunization: Secondary | ICD-10-CM

## 2022-07-10 DIAGNOSIS — Z01419 Encounter for gynecological examination (general) (routine) without abnormal findings: Secondary | ICD-10-CM | POA: Diagnosis present

## 2022-07-10 NOTE — Progress Notes (Signed)
Subjective:     Sheri Walker is a 26 y.o. female P1 with amenorrhea and BMI 22 who is here for a comprehensive physical exam. The patient reports no problems. She is sexually active using Mirena IUD for contraception and states that she and her partner can feel the device at times during intercourse. She denies pelvic pain or abnormal discharge. Patient is without any other complaints. She desires STI testing  Past Medical History:  Diagnosis Date   Acne    Allergy    Asthma    Asthma    Phreesia 01/02/2021   History of pyelonephritis during pregnancy    HSV-2 (herpes simplex virus 2) infection 2016   Past Surgical History:  Procedure Laterality Date   NO PAST SURGERIES     Family History  Problem Relation Age of Onset   Allergies Mother    Hyperlipidemia Father     Social History   Socioeconomic History   Marital status: Significant Other    Spouse name: Not on file   Number of children: Not on file   Years of education: Not on file   Highest education level: Not on file  Occupational History   Not on file  Tobacco Use   Smoking status: Never   Smokeless tobacco: Never  Vaping Use   Vaping Use: Never used  Substance and Sexual Activity   Alcohol use: Yes    Comment: occasionally    Drug use: Yes    Types: Marijuana    Comment: last used 05/30/2022   Sexual activity: Yes    Birth control/protection: None  Other Topics Concern   Not on file  Social History Narrative   Not on file   Social Determinants of Health   Financial Resource Strain: Not on file  Food Insecurity: No Food Insecurity (07/10/2021)   Hunger Vital Sign    Worried About Running Out of Food in the Last Year: Never true    Ran Out of Food in the Last Year: Never true  Transportation Needs: No Transportation Needs (07/10/2021)   PRAPARE - Hydrologist (Medical): No    Lack of Transportation (Non-Medical): No  Physical Activity: Not on file  Stress: Not on file   Social Connections: Not on file  Intimate Partner Violence: Not At Risk (12/17/2020)   Humiliation, Afraid, Rape, and Kick questionnaire    Fear of Current or Ex-Partner: No    Emotionally Abused: No    Physically Abused: No    Sexually Abused: No   Health Maintenance  Topic Date Due   COVID-19 Vaccine (3 - Pfizer series) 04/03/2020   PAP-Cervical Cytology Screening  06/26/2023   PAP SMEAR-Modifier  06/26/2023   TETANUS/TDAP  11/05/2030   INFLUENZA VACCINE  Completed   HPV VACCINES  Completed   Hepatitis C Screening  Completed   HIV Screening  Completed       Review of Systems Pertinent items noted in HPI and remainder of comprehensive ROS otherwise negative.   Objective:  Blood pressure 127/71, pulse 97, weight 125 lb 3.2 oz (56.8 kg), not currently breastfeeding.   GENERAL: Well-developed, well-nourished female in no acute distress.  HEENT: Normocephalic, atraumatic. Sclerae anicteric.  NECK: Supple. Normal thyroid.  LUNGS: Clear to auscultation bilaterally.  HEART: Regular rate and rhythm. BREASTS: Symmetric in size. No palpable masses or lymphadenopathy, skin changes, or nipple drainage. ABDOMEN: Soft, nontender, nondistended. No organomegaly. PELVIC: Normal external female genitalia. Vagina is pink and rugated.  Normal  discharge. Normal appearing cervix with IUD strings visualized curled into the posterior fornix. Uterus is normal in size. No adnexal mass or tenderness. Chaperone present during the pelvic exam EXTREMITIES: No cyanosis, clubbing, or edema, 2+ distal pulses.     Assessment:    Healthy female exam.      Plan:    Patient current on pap smear STI screening per patient request Reassurance provided regarding IUD location Patient will be contacted with abnormal results See After Visit Summary for Counseling Recommendations

## 2022-07-11 LAB — CERVICOVAGINAL ANCILLARY ONLY
Chlamydia: NEGATIVE
Comment: NEGATIVE
Comment: NORMAL
Neisseria Gonorrhea: NEGATIVE

## 2022-07-11 LAB — HEPATITIS B SURFACE ANTIGEN: Hepatitis B Surface Ag: NEGATIVE

## 2022-07-11 LAB — RPR: RPR Ser Ql: NONREACTIVE

## 2022-07-11 LAB — HEPATITIS C ANTIBODY: Hep C Virus Ab: NONREACTIVE

## 2022-07-11 LAB — HIV ANTIBODY (ROUTINE TESTING W REFLEX): HIV Screen 4th Generation wRfx: NONREACTIVE

## 2022-07-22 NOTE — Telephone Encounter (Signed)
Error

## 2023-05-20 DIAGNOSIS — F4323 Adjustment disorder with mixed anxiety and depressed mood: Secondary | ICD-10-CM | POA: Diagnosis not present

## 2023-05-27 DIAGNOSIS — F4323 Adjustment disorder with mixed anxiety and depressed mood: Secondary | ICD-10-CM | POA: Diagnosis not present

## 2023-06-03 DIAGNOSIS — F4323 Adjustment disorder with mixed anxiety and depressed mood: Secondary | ICD-10-CM | POA: Diagnosis not present

## 2023-06-04 ENCOUNTER — Ambulatory Visit: Payer: Medicaid Other | Admitting: Obstetrics and Gynecology

## 2023-06-10 DIAGNOSIS — F4323 Adjustment disorder with mixed anxiety and depressed mood: Secondary | ICD-10-CM | POA: Diagnosis not present

## 2023-06-24 DIAGNOSIS — F4323 Adjustment disorder with mixed anxiety and depressed mood: Secondary | ICD-10-CM | POA: Diagnosis not present

## 2023-07-01 DIAGNOSIS — F4323 Adjustment disorder with mixed anxiety and depressed mood: Secondary | ICD-10-CM | POA: Diagnosis not present

## 2023-07-08 DIAGNOSIS — F4323 Adjustment disorder with mixed anxiety and depressed mood: Secondary | ICD-10-CM | POA: Diagnosis not present

## 2023-07-09 ENCOUNTER — Other Ambulatory Visit (HOSPITAL_COMMUNITY)
Admission: RE | Admit: 2023-07-09 | Discharge: 2023-07-09 | Disposition: A | Discharge status: Home / Self Care | Payer: Medicaid Other | Source: Ambulatory Visit | Attending: Family Medicine | Admitting: Family Medicine

## 2023-07-09 ENCOUNTER — Other Ambulatory Visit: Payer: Self-pay

## 2023-07-09 ENCOUNTER — Ambulatory Visit: Payer: Medicaid Other | Admitting: Family Medicine

## 2023-07-09 ENCOUNTER — Encounter: Payer: Self-pay | Admitting: Family Medicine

## 2023-07-09 VITALS — BP 122/86 | HR 81 | Wt 125.2 lb

## 2023-07-09 DIAGNOSIS — F32A Depression, unspecified: Secondary | ICD-10-CM | POA: Diagnosis not present

## 2023-07-09 DIAGNOSIS — J4520 Mild intermittent asthma, uncomplicated: Secondary | ICD-10-CM

## 2023-07-09 DIAGNOSIS — Z113 Encounter for screening for infections with a predominantly sexual mode of transmission: Secondary | ICD-10-CM | POA: Diagnosis not present

## 2023-07-09 DIAGNOSIS — Z124 Encounter for screening for malignant neoplasm of cervix: Secondary | ICD-10-CM | POA: Diagnosis not present

## 2023-07-09 DIAGNOSIS — Z975 Presence of (intrauterine) contraceptive device: Secondary | ICD-10-CM | POA: Diagnosis not present

## 2023-07-09 DIAGNOSIS — Z23 Encounter for immunization: Secondary | ICD-10-CM

## 2023-07-09 DIAGNOSIS — Z91010 Allergy to peanuts: Secondary | ICD-10-CM

## 2023-07-09 MED ORDER — ALBUTEROL SULFATE HFA 108 (90 BASE) MCG/ACT IN AERS: 2.0000 | INHALATION_SPRAY | Freq: Four times a day (QID) | RESPIRATORY_TRACT | 0 refills | Status: DC | PRN

## 2023-07-09 MED ORDER — SYMBICORT 80-4.5 MCG/ACT IN AERO: 2.0000 | INHALATION_SPRAY | Freq: Every day | RESPIRATORY_TRACT | 5 refills | Status: AC

## 2023-07-09 MED ORDER — EPINEPHRINE 0.3 MG/0.3ML IJ SOAJ: 0.3000 mg | INTRAMUSCULAR | 1 refills | Status: AC | PRN

## 2023-07-09 NOTE — Progress Notes (Signed)
GYNECOLOGY OFFICE VISIT NOTE  History:   Sheri Walker is a 27 y.o. G2P1011 here today for multiple issues.  She is worried about possible bipolar symptoms Endorses having manic episodes or something like it without going into significant detail Going to see a therapist as well as relationship counselor but has never seen psych Would be open to psychiatry referral  Wondering about removal of Liletta IUD and switching to a non-hormonal method Worried it may be affecting her mood Sometimes she and her partner can feel it during intercourse but she is otherwise very happy with the method Wondering about copper IUD as an alternative  Also due for pap, ok with collecting today  Needs refill on various meds Needs asthma meds refilled Also would like epi pen Reports she used to carry one, denies anaphylaxis in the past   Health Maintenance Due  Topic Date Due   INFLUENZA VACCINE  05/14/2023   COVID-19 Vaccine (3 - 2023-24 season) 06/14/2023   Cervical Cancer Screening (Pap smear)  06/26/2023    Past Medical History:  Diagnosis Date   Acne    Allergy    Asthma    Asthma    Phreesia 01/02/2021   History of pyelonephritis during pregnancy    HSV-2 (herpes simplex virus 2) infection 2016    Past Surgical History:  Procedure Laterality Date   NO PAST SURGERIES      The following portions of the patient's history were reviewed and updated as appropriate: allergies, current medications, past family history, past medical history, past social history, past surgical history and problem list.   Health Maintenance:   Last pap: Lab Results  Component Value Date   DIAGPAP  06/25/2020    - Negative for intraepithelial lesion or malignancy (NILM)   Due for repeat, collected today  Last mammogram:  N/a    Review of Systems:  Pertinent items noted in HPI and remainder of comprehensive ROS otherwise negative.  Physical Exam:  BP 122/86   Pulse 81   Wt 125 lb 3.2 oz  (56.8 kg)   BMI 22.18 kg/m  CONSTITUTIONAL: Well-developed, well-nourished female in no acute distress.  HEENT:  Normocephalic, atraumatic. External right and left ear normal. No scleral icterus.  NECK: Normal range of motion, supple, no masses noted on observation SKIN: No rash noted. Not diaphoretic. No erythema. No pallor. MUSCULOSKELETAL: Normal range of motion. No edema noted. NEUROLOGIC: Alert and oriented to person, place, and time. Normal muscle tone coordination.  PSYCHIATRIC: Normal mood and affect. Normal behavior. Normal judgment and thought content. RESPIRATORY: Effort normal, no problems with respiration noted ABDOMEN: No masses noted. No other overt distention noted.   PELVIC: Normal appearing external genitalia; normal appearing vaginal mucosa and cervix.  No abnormal discharge noted. IUD strings visualized curling into posterior fornix.  Labs and Imaging No results found for this or any previous visit (from the past 168 hour(s)). No results found.    Assessment and Plan:   Problem List Items Addressed This Visit       Respiratory   Asthma   Relevant Medications   budesonide-formoterol (SYMBICORT) 80-4.5 MCG/ACT inhaler   albuterol (VENTOLIN HFA) 108 (90 Base) MCG/ACT inhaler     Other   Depression    Reports mood changes and concern for bipolar, accepts referral to psychiatry.       Relevant Orders   Ambulatory referral to Psychiatry   IUD (intrauterine device) in place    Discussed pros and cons of switching  to copper IUD. Reviewed that systemic absorption of hormone is incredibly low and unlikely to be related to her mood changes. Also discussed downside of copper IUD is possibility of heavier and longer periods. Given she is happy with the method I'm not sure she would benefit from switching but emphasized that ultimately it is her choice. After discussion she elected to continue with hormonal IUD, but will get in touch if she wants to change method.        PERSONAL HISTORY OF ALLERGY TO PEANUTS   Relevant Medications   EPINEPHrine (EPIPEN 2-PAK) 0.3 mg/0.3 mL IJ SOAJ injection   Other Visit Diagnoses     Screening for cervical cancer    -  Primary   Relevant Orders   Cytology - PAP( [place])       Routine preventative health maintenance measures emphasized. Please refer to After Visit Summary for other counseling recommendations.   No follow-ups on file.    Total face-to-face time with patient: 20 minutes.  Over 50% of encounter was spent on counseling and coordination of care.   Venora Maples, MD/MPH Attending Family Medicine Physician, [facility] for [facility], [facility]

## 2023-07-09 NOTE — Assessment & Plan Note (Signed)
Discussed pros and cons of switching to copper IUD. Reviewed that systemic absorption of hormone is incredibly low and unlikely to be related to her mood changes. Also discussed downside of copper IUD is possibility of heavier and longer periods. Given she is happy with the method I'm not sure she would benefit from switching but emphasized that ultimately it is her choice. After discussion she elected to continue with hormonal IUD, but will get in touch if she wants to change method.

## 2023-07-09 NOTE — Assessment & Plan Note (Signed)
Reports mood changes and concern for bipolar, accepts referral to psychiatry.

## 2023-07-09 NOTE — Addendum Note (Signed)
Addended by: Faythe Casa on: 07/09/2023 02:48 PM   Modules accepted: Orders

## 2023-07-10 LAB — CERVICOVAGINAL ANCILLARY ONLY
Bacterial Vaginitis (gardnerella): NEGATIVE
Candida Glabrata: NEGATIVE
Candida Vaginitis: NEGATIVE
Comment: NEGATIVE
Comment: NEGATIVE
Comment: NEGATIVE

## 2023-07-15 DIAGNOSIS — F4323 Adjustment disorder with mixed anxiety and depressed mood: Secondary | ICD-10-CM | POA: Diagnosis not present

## 2023-07-16 DIAGNOSIS — F419 Anxiety disorder, unspecified: Secondary | ICD-10-CM | POA: Diagnosis not present

## 2023-07-16 DIAGNOSIS — F32A Depression, unspecified: Secondary | ICD-10-CM | POA: Diagnosis not present

## 2023-07-16 LAB — CYTOLOGY - PAP
Chlamydia: NEGATIVE
Comment: NEGATIVE
Comment: NEGATIVE
Comment: NORMAL
Diagnosis: NEGATIVE
Neisseria Gonorrhea: NEGATIVE
Trichomonas: NEGATIVE

## 2023-07-22 DIAGNOSIS — F4323 Adjustment disorder with mixed anxiety and depressed mood: Secondary | ICD-10-CM | POA: Diagnosis not present

## 2023-07-23 DIAGNOSIS — F419 Anxiety disorder, unspecified: Secondary | ICD-10-CM | POA: Diagnosis not present

## 2023-07-23 DIAGNOSIS — F32A Depression, unspecified: Secondary | ICD-10-CM | POA: Diagnosis not present

## 2023-07-29 DIAGNOSIS — F4323 Adjustment disorder with mixed anxiety and depressed mood: Secondary | ICD-10-CM | POA: Diagnosis not present

## 2023-07-31 ENCOUNTER — Other Ambulatory Visit: Payer: Self-pay | Admitting: Family Medicine

## 2023-07-31 DIAGNOSIS — J4520 Mild intermittent asthma, uncomplicated: Secondary | ICD-10-CM

## 2023-07-31 DIAGNOSIS — F419 Anxiety disorder, unspecified: Secondary | ICD-10-CM | POA: Diagnosis not present

## 2023-07-31 DIAGNOSIS — F32A Depression, unspecified: Secondary | ICD-10-CM | POA: Diagnosis not present

## 2023-08-10 ENCOUNTER — Encounter: Payer: Self-pay | Admitting: Lactation Services

## 2023-08-10 ENCOUNTER — Other Ambulatory Visit: Payer: Self-pay | Admitting: Lactation Services

## 2023-08-10 DIAGNOSIS — J4520 Mild intermittent asthma, uncomplicated: Secondary | ICD-10-CM

## 2023-08-10 MED ORDER — ALBUTEROL SULFATE HFA 108 (90 BASE) MCG/ACT IN AERS: 2.0000 | INHALATION_SPRAY | Freq: Four times a day (QID) | RESPIRATORY_TRACT | 2 refills | Status: AC | PRN

## 2023-08-10 NOTE — Telephone Encounter (Signed)
Received refill request from [facility]. Will route to provider for determination.

## 2023-08-11 ENCOUNTER — Encounter: Payer: Self-pay | Admitting: Lactation Services

## 2023-08-12 DIAGNOSIS — F4323 Adjustment disorder with mixed anxiety and depressed mood: Secondary | ICD-10-CM | POA: Diagnosis not present

## 2023-08-14 DIAGNOSIS — F32A Depression, unspecified: Secondary | ICD-10-CM | POA: Diagnosis not present

## 2023-08-14 DIAGNOSIS — F419 Anxiety disorder, unspecified: Secondary | ICD-10-CM | POA: Diagnosis not present

## 2023-08-19 DIAGNOSIS — F4323 Adjustment disorder with mixed anxiety and depressed mood: Secondary | ICD-10-CM | POA: Diagnosis not present

## 2023-08-21 DIAGNOSIS — F32A Depression, unspecified: Secondary | ICD-10-CM | POA: Diagnosis not present

## 2023-08-21 DIAGNOSIS — F419 Anxiety disorder, unspecified: Secondary | ICD-10-CM | POA: Diagnosis not present

## 2023-08-26 DIAGNOSIS — F4323 Adjustment disorder with mixed anxiety and depressed mood: Secondary | ICD-10-CM | POA: Diagnosis not present

## 2023-08-28 DIAGNOSIS — F419 Anxiety disorder, unspecified: Secondary | ICD-10-CM | POA: Diagnosis not present

## 2023-08-28 DIAGNOSIS — F32A Depression, unspecified: Secondary | ICD-10-CM | POA: Diagnosis not present

## 2023-09-04 DIAGNOSIS — F32A Depression, unspecified: Secondary | ICD-10-CM | POA: Diagnosis not present

## 2023-09-04 DIAGNOSIS — F419 Anxiety disorder, unspecified: Secondary | ICD-10-CM | POA: Diagnosis not present

## 2023-09-18 DIAGNOSIS — F32A Depression, unspecified: Secondary | ICD-10-CM | POA: Diagnosis not present

## 2023-09-18 DIAGNOSIS — F419 Anxiety disorder, unspecified: Secondary | ICD-10-CM | POA: Diagnosis not present

## 2023-09-23 DIAGNOSIS — F419 Anxiety disorder, unspecified: Secondary | ICD-10-CM | POA: Diagnosis not present

## 2023-09-23 DIAGNOSIS — F32A Depression, unspecified: Secondary | ICD-10-CM | POA: Diagnosis not present

## 2023-10-09 DIAGNOSIS — F4323 Adjustment disorder with mixed anxiety and depressed mood: Secondary | ICD-10-CM | POA: Diagnosis not present

## 2023-10-29 DIAGNOSIS — F419 Anxiety disorder, unspecified: Secondary | ICD-10-CM | POA: Diagnosis not present

## 2023-10-29 DIAGNOSIS — F32A Depression, unspecified: Secondary | ICD-10-CM | POA: Diagnosis not present

## 2023-11-06 DIAGNOSIS — F4323 Adjustment disorder with mixed anxiety and depressed mood: Secondary | ICD-10-CM | POA: Diagnosis not present

## 2023-11-12 DIAGNOSIS — F32A Depression, unspecified: Secondary | ICD-10-CM | POA: Diagnosis not present

## 2023-11-12 DIAGNOSIS — F419 Anxiety disorder, unspecified: Secondary | ICD-10-CM | POA: Diagnosis not present

## 2023-11-20 DIAGNOSIS — F4323 Adjustment disorder with mixed anxiety and depressed mood: Secondary | ICD-10-CM | POA: Diagnosis not present

## 2023-11-26 DIAGNOSIS — F419 Anxiety disorder, unspecified: Secondary | ICD-10-CM | POA: Diagnosis not present

## 2023-11-26 DIAGNOSIS — F32A Depression, unspecified: Secondary | ICD-10-CM | POA: Diagnosis not present

## 2023-12-04 DIAGNOSIS — F4323 Adjustment disorder with mixed anxiety and depressed mood: Secondary | ICD-10-CM | POA: Diagnosis not present

## 2023-12-10 DIAGNOSIS — F32A Depression, unspecified: Secondary | ICD-10-CM | POA: Diagnosis not present

## 2023-12-10 DIAGNOSIS — F419 Anxiety disorder, unspecified: Secondary | ICD-10-CM | POA: Diagnosis not present

## 2023-12-18 DIAGNOSIS — F4323 Adjustment disorder with mixed anxiety and depressed mood: Secondary | ICD-10-CM | POA: Diagnosis not present

## 2023-12-21 DIAGNOSIS — F32A Depression, unspecified: Secondary | ICD-10-CM | POA: Diagnosis not present

## 2023-12-21 DIAGNOSIS — F419 Anxiety disorder, unspecified: Secondary | ICD-10-CM | POA: Diagnosis not present

## 2024-01-04 DIAGNOSIS — F419 Anxiety disorder, unspecified: Secondary | ICD-10-CM | POA: Diagnosis not present

## 2024-01-04 DIAGNOSIS — F32A Depression, unspecified: Secondary | ICD-10-CM | POA: Diagnosis not present

## 2024-01-15 DIAGNOSIS — F4323 Adjustment disorder with mixed anxiety and depressed mood: Secondary | ICD-10-CM | POA: Diagnosis not present

## 2024-01-26 DIAGNOSIS — F32A Depression, unspecified: Secondary | ICD-10-CM | POA: Diagnosis not present

## 2024-01-26 DIAGNOSIS — F419 Anxiety disorder, unspecified: Secondary | ICD-10-CM | POA: Diagnosis not present

## 2024-01-29 DIAGNOSIS — F4323 Adjustment disorder with mixed anxiety and depressed mood: Secondary | ICD-10-CM | POA: Diagnosis not present

## 2024-02-02 DIAGNOSIS — F32A Depression, unspecified: Secondary | ICD-10-CM | POA: Diagnosis not present

## 2024-02-02 DIAGNOSIS — F419 Anxiety disorder, unspecified: Secondary | ICD-10-CM | POA: Diagnosis not present

## 2024-02-09 DIAGNOSIS — F419 Anxiety disorder, unspecified: Secondary | ICD-10-CM | POA: Diagnosis not present

## 2024-02-09 DIAGNOSIS — F32A Depression, unspecified: Secondary | ICD-10-CM | POA: Diagnosis not present

## 2024-02-12 DIAGNOSIS — F4323 Adjustment disorder with mixed anxiety and depressed mood: Secondary | ICD-10-CM | POA: Diagnosis not present

## 2024-02-17 DIAGNOSIS — F4323 Adjustment disorder with mixed anxiety and depressed mood: Secondary | ICD-10-CM | POA: Diagnosis not present

## 2024-02-19 DIAGNOSIS — F419 Anxiety disorder, unspecified: Secondary | ICD-10-CM | POA: Diagnosis not present

## 2024-02-19 DIAGNOSIS — F32A Depression, unspecified: Secondary | ICD-10-CM | POA: Diagnosis not present

## 2024-02-24 DIAGNOSIS — F32A Depression, unspecified: Secondary | ICD-10-CM | POA: Diagnosis not present

## 2024-02-24 DIAGNOSIS — F419 Anxiety disorder, unspecified: Secondary | ICD-10-CM | POA: Diagnosis not present

## 2024-02-26 DIAGNOSIS — F4323 Adjustment disorder with mixed anxiety and depressed mood: Secondary | ICD-10-CM | POA: Diagnosis not present

## 2024-03-01 DIAGNOSIS — F4323 Adjustment disorder with mixed anxiety and depressed mood: Secondary | ICD-10-CM | POA: Diagnosis not present

## 2024-03-02 DIAGNOSIS — F32A Depression, unspecified: Secondary | ICD-10-CM | POA: Diagnosis not present

## 2024-03-02 DIAGNOSIS — F419 Anxiety disorder, unspecified: Secondary | ICD-10-CM | POA: Diagnosis not present

## 2024-03-09 DIAGNOSIS — F4323 Adjustment disorder with mixed anxiety and depressed mood: Secondary | ICD-10-CM | POA: Diagnosis not present

## 2024-03-16 DIAGNOSIS — F419 Anxiety disorder, unspecified: Secondary | ICD-10-CM | POA: Diagnosis not present

## 2024-03-23 DIAGNOSIS — F419 Anxiety disorder, unspecified: Secondary | ICD-10-CM | POA: Diagnosis not present

## 2024-03-25 DIAGNOSIS — F4323 Adjustment disorder with mixed anxiety and depressed mood: Secondary | ICD-10-CM | POA: Diagnosis not present

## 2024-03-30 DIAGNOSIS — F419 Anxiety disorder, unspecified: Secondary | ICD-10-CM | POA: Diagnosis not present

## 2024-04-13 DIAGNOSIS — F419 Anxiety disorder, unspecified: Secondary | ICD-10-CM | POA: Diagnosis not present

## 2024-04-20 DIAGNOSIS — F419 Anxiety disorder, unspecified: Secondary | ICD-10-CM | POA: Diagnosis not present

## 2024-04-22 DIAGNOSIS — F4323 Adjustment disorder with mixed anxiety and depressed mood: Secondary | ICD-10-CM | POA: Diagnosis not present

## 2024-05-04 DIAGNOSIS — F419 Anxiety disorder, unspecified: Secondary | ICD-10-CM | POA: Diagnosis not present

## 2024-05-06 DIAGNOSIS — F4323 Adjustment disorder with mixed anxiety and depressed mood: Secondary | ICD-10-CM | POA: Diagnosis not present

## 2024-05-11 DIAGNOSIS — F419 Anxiety disorder, unspecified: Secondary | ICD-10-CM | POA: Diagnosis not present

## 2024-05-12 DIAGNOSIS — F4323 Adjustment disorder with mixed anxiety and depressed mood: Secondary | ICD-10-CM | POA: Diagnosis not present

## 2024-05-18 DIAGNOSIS — F419 Anxiety disorder, unspecified: Secondary | ICD-10-CM | POA: Diagnosis not present

## 2024-05-25 DIAGNOSIS — F419 Anxiety disorder, unspecified: Secondary | ICD-10-CM | POA: Diagnosis not present

## 2024-05-27 DIAGNOSIS — F4323 Adjustment disorder with mixed anxiety and depressed mood: Secondary | ICD-10-CM | POA: Diagnosis not present

## 2024-06-03 DIAGNOSIS — F4323 Adjustment disorder with mixed anxiety and depressed mood: Secondary | ICD-10-CM | POA: Diagnosis not present

## 2024-06-08 DIAGNOSIS — F419 Anxiety disorder, unspecified: Secondary | ICD-10-CM | POA: Diagnosis not present

## 2024-06-10 DIAGNOSIS — F4323 Adjustment disorder with mixed anxiety and depressed mood: Secondary | ICD-10-CM | POA: Diagnosis not present

## 2024-06-15 DIAGNOSIS — F419 Anxiety disorder, unspecified: Secondary | ICD-10-CM | POA: Diagnosis not present

## 2024-06-22 DIAGNOSIS — F419 Anxiety disorder, unspecified: Secondary | ICD-10-CM | POA: Diagnosis not present

## 2024-06-24 DIAGNOSIS — F4323 Adjustment disorder with mixed anxiety and depressed mood: Secondary | ICD-10-CM | POA: Diagnosis not present

## 2024-06-29 DIAGNOSIS — F419 Anxiety disorder, unspecified: Secondary | ICD-10-CM | POA: Diagnosis not present

## 2024-07-06 DIAGNOSIS — F419 Anxiety disorder, unspecified: Secondary | ICD-10-CM | POA: Diagnosis not present

## 2024-07-19 ENCOUNTER — Other Ambulatory Visit: Payer: Self-pay | Admitting: Family Medicine

## 2024-07-19 DIAGNOSIS — Z91010 Allergy to peanuts: Secondary | ICD-10-CM

## 2024-07-25 NOTE — Telephone Encounter (Signed)
 Incoming fax Rx refill for Epinephrine  0.3mg  inj 2 pack refill.  Will route to provider for approval.  Ermalinda GRADE CMA

## 2024-07-27 DIAGNOSIS — F419 Anxiety disorder, unspecified: Secondary | ICD-10-CM | POA: Diagnosis not present

## 2024-07-29 DIAGNOSIS — F4323 Adjustment disorder with mixed anxiety and depressed mood: Secondary | ICD-10-CM | POA: Diagnosis not present

## 2024-08-03 DIAGNOSIS — F4323 Adjustment disorder with mixed anxiety and depressed mood: Secondary | ICD-10-CM | POA: Diagnosis not present

## 2024-08-10 DIAGNOSIS — F419 Anxiety disorder, unspecified: Secondary | ICD-10-CM | POA: Diagnosis not present

## 2024-08-12 DIAGNOSIS — F4323 Adjustment disorder with mixed anxiety and depressed mood: Secondary | ICD-10-CM | POA: Diagnosis not present

## 2024-08-17 DIAGNOSIS — F419 Anxiety disorder, unspecified: Secondary | ICD-10-CM | POA: Diagnosis not present

## 2024-08-26 DIAGNOSIS — F4323 Adjustment disorder with mixed anxiety and depressed mood: Secondary | ICD-10-CM | POA: Diagnosis not present

## 2024-09-02 DIAGNOSIS — F4323 Adjustment disorder with mixed anxiety and depressed mood: Secondary | ICD-10-CM | POA: Diagnosis not present

## 2024-09-09 DIAGNOSIS — F4323 Adjustment disorder with mixed anxiety and depressed mood: Secondary | ICD-10-CM | POA: Diagnosis not present

## 2024-09-14 DIAGNOSIS — F419 Anxiety disorder, unspecified: Secondary | ICD-10-CM | POA: Diagnosis not present

## 2024-09-23 DIAGNOSIS — F4323 Adjustment disorder with mixed anxiety and depressed mood: Secondary | ICD-10-CM | POA: Diagnosis not present

## 2024-11-21 ENCOUNTER — Ambulatory Visit: Admitting: Obstetrics and Gynecology
